# Patient Record
Sex: Female | Born: 1975 | Race: Black or African American | Hispanic: No | Marital: Single | Smoking: Never smoker
Health system: Southern US, Community
[De-identification: ages and names within clinical notes are randomized; demographics above are authoritative.]

## PROBLEM LIST (undated history)

## (undated) ENCOUNTER — Inpatient Hospital Stay (HOSPITAL_COMMUNITY): Payer: Self-pay

## (undated) DIAGNOSIS — G43909 Migraine, unspecified, not intractable, without status migrainosus: Secondary | ICD-10-CM

## (undated) DIAGNOSIS — Z98891 History of uterine scar from previous surgery: Secondary | ICD-10-CM

## (undated) DIAGNOSIS — F32A Depression, unspecified: Secondary | ICD-10-CM

## (undated) DIAGNOSIS — O10919 Unspecified pre-existing hypertension complicating pregnancy, unspecified trimester: Secondary | ICD-10-CM

## (undated) DIAGNOSIS — Z639 Problem related to primary support group, unspecified: Secondary | ICD-10-CM

## (undated) DIAGNOSIS — IMO0002 Reserved for concepts with insufficient information to code with codable children: Secondary | ICD-10-CM

## (undated) DIAGNOSIS — F329 Major depressive disorder, single episode, unspecified: Secondary | ICD-10-CM

## (undated) DIAGNOSIS — T7840XA Allergy, unspecified, initial encounter: Secondary | ICD-10-CM

## (undated) DIAGNOSIS — S62609A Fracture of unspecified phalanx of unspecified finger, initial encounter for closed fracture: Secondary | ICD-10-CM

## (undated) HISTORY — DX: Depression, unspecified: F32.A

## (undated) HISTORY — DX: Migraine, unspecified, not intractable, without status migrainosus: G43.909

## (undated) HISTORY — DX: Major depressive disorder, single episode, unspecified: F32.9

## (undated) HISTORY — DX: Allergy, unspecified, initial encounter: T78.40XA

## (undated) HISTORY — DX: Fracture of unspecified phalanx of unspecified finger, initial encounter for closed fracture: S62.609A

---

## 1998-04-11 ENCOUNTER — Emergency Department (HOSPITAL_COMMUNITY): Admission: EM | Admit: 1998-04-11 | Discharge: 1998-04-11 | Payer: Self-pay | Admitting: Emergency Medicine

## 1998-04-12 ENCOUNTER — Emergency Department (HOSPITAL_COMMUNITY): Admission: EM | Admit: 1998-04-12 | Discharge: 1998-04-12 | Payer: Self-pay | Admitting: Emergency Medicine

## 1998-09-09 ENCOUNTER — Emergency Department (HOSPITAL_COMMUNITY): Admission: EM | Admit: 1998-09-09 | Discharge: 1998-09-09 | Payer: Self-pay | Admitting: Emergency Medicine

## 1999-03-30 ENCOUNTER — Emergency Department (HOSPITAL_COMMUNITY): Admission: EM | Admit: 1999-03-30 | Discharge: 1999-03-30 | Payer: Self-pay

## 1999-04-08 ENCOUNTER — Inpatient Hospital Stay (HOSPITAL_COMMUNITY): Admission: AD | Admit: 1999-04-08 | Discharge: 1999-04-08 | Payer: Self-pay | Admitting: *Deleted

## 2000-10-16 ENCOUNTER — Inpatient Hospital Stay (HOSPITAL_COMMUNITY): Admission: AD | Admit: 2000-10-16 | Discharge: 2000-10-16 | Payer: Self-pay | Admitting: *Deleted

## 2001-03-29 ENCOUNTER — Other Ambulatory Visit: Admission: RE | Admit: 2001-03-29 | Discharge: 2001-03-29 | Payer: Self-pay | Admitting: *Deleted

## 2001-08-30 ENCOUNTER — Other Ambulatory Visit: Admission: RE | Admit: 2001-08-30 | Discharge: 2001-08-30 | Payer: Self-pay | Admitting: Obstetrics and Gynecology

## 2001-09-17 ENCOUNTER — Inpatient Hospital Stay (HOSPITAL_COMMUNITY): Admission: AD | Admit: 2001-09-17 | Discharge: 2001-09-17 | Payer: Self-pay | Admitting: *Deleted

## 2003-10-09 ENCOUNTER — Other Ambulatory Visit: Admission: RE | Admit: 2003-10-09 | Discharge: 2003-10-09 | Payer: Self-pay | Admitting: Obstetrics and Gynecology

## 2003-10-10 ENCOUNTER — Ambulatory Visit (HOSPITAL_COMMUNITY): Admission: RE | Admit: 2003-10-10 | Discharge: 2003-10-10 | Payer: Self-pay | Admitting: Obstetrics and Gynecology

## 2004-01-01 ENCOUNTER — Inpatient Hospital Stay (HOSPITAL_COMMUNITY): Admission: AD | Admit: 2004-01-01 | Discharge: 2004-01-01 | Payer: Self-pay | Admitting: Obstetrics and Gynecology

## 2004-06-24 ENCOUNTER — Inpatient Hospital Stay (HOSPITAL_COMMUNITY): Admission: AD | Admit: 2004-06-24 | Discharge: 2004-06-24 | Payer: Self-pay | Admitting: Family Medicine

## 2004-10-28 ENCOUNTER — Inpatient Hospital Stay (HOSPITAL_COMMUNITY): Admission: AD | Admit: 2004-10-28 | Discharge: 2004-10-29 | Payer: Self-pay | Admitting: Obstetrics

## 2004-10-29 ENCOUNTER — Inpatient Hospital Stay (HOSPITAL_COMMUNITY): Admission: AD | Admit: 2004-10-29 | Discharge: 2004-10-29 | Payer: Self-pay | Admitting: Obstetrics & Gynecology

## 2004-12-31 ENCOUNTER — Inpatient Hospital Stay (HOSPITAL_COMMUNITY): Admission: AD | Admit: 2004-12-31 | Discharge: 2005-01-02 | Payer: Self-pay | Admitting: Obstetrics and Gynecology

## 2005-07-04 ENCOUNTER — Emergency Department (HOSPITAL_COMMUNITY): Admission: EM | Admit: 2005-07-04 | Discharge: 2005-07-04 | Payer: Self-pay | Admitting: Emergency Medicine

## 2006-04-14 ENCOUNTER — Inpatient Hospital Stay (HOSPITAL_COMMUNITY): Admission: AD | Admit: 2006-04-14 | Discharge: 2006-04-14 | Payer: Self-pay | Admitting: Obstetrics & Gynecology

## 2007-06-03 ENCOUNTER — Emergency Department (HOSPITAL_COMMUNITY): Admission: EM | Admit: 2007-06-03 | Discharge: 2007-06-03 | Payer: Self-pay | Admitting: Emergency Medicine

## 2007-09-14 ENCOUNTER — Emergency Department (HOSPITAL_COMMUNITY): Admission: EM | Admit: 2007-09-14 | Discharge: 2007-09-14 | Payer: Self-pay | Admitting: Emergency Medicine

## 2007-11-23 ENCOUNTER — Emergency Department (HOSPITAL_COMMUNITY): Admission: EM | Admit: 2007-11-23 | Discharge: 2007-11-23 | Payer: Self-pay | Admitting: Emergency Medicine

## 2008-03-25 ENCOUNTER — Emergency Department (HOSPITAL_COMMUNITY): Admission: EM | Admit: 2008-03-25 | Discharge: 2008-03-25 | Payer: Self-pay | Admitting: Family Medicine

## 2008-04-12 ENCOUNTER — Encounter: Admission: RE | Admit: 2008-04-12 | Discharge: 2008-04-12 | Payer: Self-pay | Admitting: Specialist

## 2010-06-05 ENCOUNTER — Inpatient Hospital Stay (HOSPITAL_COMMUNITY): Admission: AD | Admit: 2010-06-05 | Discharge: 2009-11-09 | Payer: Self-pay | Admitting: Obstetrics & Gynecology

## 2010-09-16 LAB — URINE MICROSCOPIC-ADD ON: WBC, UA: NONE SEEN WBC/hpf (ref <3)

## 2010-09-16 LAB — URINALYSIS, ROUTINE W REFLEX MICROSCOPIC
Bilirubin Urine: NEGATIVE
Glucose, UA: NEGATIVE mg/dL
Ketones, ur: NEGATIVE mg/dL
Leukocytes, UA: NEGATIVE
Nitrite: NEGATIVE
Protein, ur: NEGATIVE mg/dL
Specific Gravity, Urine: 1.025 (ref 1.005–1.030)
Urobilinogen, UA: 0.2 mg/dL (ref 0.0–1.0)
pH: 6 (ref 5.0–8.0)

## 2010-09-16 LAB — URINE CULTURE: Colony Count: 50000

## 2010-10-22 ENCOUNTER — Ambulatory Visit (INDEPENDENT_AMBULATORY_CARE_PROVIDER_SITE_OTHER): Payer: BC Managed Care – PPO | Admitting: Family Medicine

## 2010-10-22 ENCOUNTER — Encounter: Payer: Self-pay | Admitting: Family Medicine

## 2010-10-22 VITALS — BP 140/88 | HR 76 | Ht 64.0 in | Wt 228.8 lb

## 2010-10-22 DIAGNOSIS — S62609A Fracture of unspecified phalanx of unspecified finger, initial encounter for closed fracture: Secondary | ICD-10-CM | POA: Insufficient documentation

## 2010-10-22 DIAGNOSIS — F418 Other specified anxiety disorders: Secondary | ICD-10-CM

## 2010-10-22 DIAGNOSIS — F341 Dysthymic disorder: Secondary | ICD-10-CM

## 2010-10-22 NOTE — Assessment & Plan Note (Signed)
Occurred 1 month ago Healing well rto prn

## 2010-10-22 NOTE — Patient Instructions (Signed)

## 2010-10-22 NOTE — Assessment & Plan Note (Signed)
Pt refusing meds but is willing to go to counselor rto prn

## 2010-10-22 NOTE — Progress Notes (Signed)
  Subjective:   Lisa Pitts is an 35 y.o. female who presents for evaluation and treatment of depressive symptoms.  Onset approximately 1 month ago, gradually worsening since that time.  Current symptoms include depressed mood, insomnia, fatigue, difficulty concentrating, anxiety, panic attacks, loss of energy/fatigue, disturbed sleep and increased stress at work---a lot of changes at one time.  She works for [employer]. Current treatment for depression:None Sleep problems: Mild   Early awakening:Absent   Energy: Poor Motivation: Fair Concentration: Poor Rumination/worrying: Moderate Memory: Excellent Tearfulness: Absent  Anxiety: Moderate  Panic: Moderate  Overall Mood: No change  Hopelessness: Absent Suicidal ideation: Absent  Other/Psychosocial Stressors: work, relationship Family history positive for depression in the patient's paternal grandmother.  Previous treatment modalities employed include None.  Past episodes of depression:none Organic causes of depression present: None.  Review of Systems Pertinent items are noted in HPI.   Objective:   Mental Status Examination: Posture and motor behavior: Appropriate Dress, grooming, personal hygiene: Appropriate Facial expression: Appropriate Speech: Appropriate Mood: Positive for flat affect Coherency and relevance of thought: Appropriate Thought content: Appropriate Perceptions: Appropriate Orientation:Appropriate Attention and concentration: Appropriate Memory: : Appropriate Information:  Vocabulary: Appropriate Abstract reasoning: Appropriate Judgment: Appropriate     Assessment:     Experiencing the following symptoms of depression most of the day nearly every day for more than two consecutive weeks: depressed mood  Depressive Disorder:  With anxiety and panic attacks  Suicide Risk Assessment:  Suicidal intent: no Suicidal plan: no Access to means for suicide: no Lethality of means for suicide: no Prior  suicide attempts: no Recent exposure to suicide:no  2)  Fractured R middle finger---healing Plan:   anxiety / depression  Reviewed concept of depression as biochemical imbalance of neurotransmitters and rationale for treatment. Instructed patient to contact office or on-call physician promptly should condition worsen or any new symptoms appear and provided on-call telephone numbers.

## 2010-10-31 ENCOUNTER — Telehealth: Payer: Self-pay | Admitting: Family Medicine

## 2010-10-31 NOTE — Telephone Encounter (Signed)
Pt still insist that she discuss these issue at OV. Advise Pt OV will be needed to discuss further and for Rx to be written. Pt decline OV at this time. Pt note that she does not understand why she would need to come back in again since she mention this at last OV.

## 2010-10-31 NOTE — Telephone Encounter (Signed)
She was seen for her finger and depression

## 2010-10-31 NOTE — Telephone Encounter (Signed)
Patient was seen 2 weeks ago - she tried allegra - zyrtec - benedryl for allergies - she said they dont help wants to know if there is something else she can try [facility] [address]

## 2010-10-31 NOTE — Telephone Encounter (Signed)
Please advise 

## 2010-11-03 MED ORDER — FLUTICASONE PROPIONATE 50 MCG/ACT NA SUSP: 2.0000 | Freq: Every day | NASAL | Status: DC

## 2010-11-03 NOTE — Telephone Encounter (Signed)
If truly allergy she should be taking antihistamine regularly---we can call in flonase 2 sprays each nostril qd #1----anything more than that she would need to be seen.

## 2010-11-03 NOTE — Telephone Encounter (Signed)
VM left advising patient RX faxed to the pharmacy     KP

## 2010-11-14 NOTE — H&P (Signed)
Lisa Pitts, Lisa Pitts              ACCOUNT NO.:  [id number]   MEDICAL RECORD NO.:  [id number]          PATIENT TYPE:  INP   LOCATION:  9166                          FACILITY:  [facility]   PHYSICIAN:  Lenoard Aden, M.D.DATE OF BIRTH:  15-Jul-1975   DATE OF ADMISSION:  12/31/2004  DATE OF DISCHARGE:                                HISTORY & PHYSICAL   CHIEF COMPLAINT:  Gestational hypertension at term.   HISTORY OF PRESENT ILLNESS:  The patient is a 35 year old African-American  female, G3, P0, EDC of January 19, 2005 at 37 plus weeks gestation with  elevated blood pressure and headache, for induction.   PAST MEDICAL HISTORY:  1.  TAB in 2002.  2.  Missed AB with D&E at 16 weeks in 2004.   SOCIAL HISTORY:  She is a nonsmoker, nondrinker.  Denies domestic or  physical violence.   MEDICATIONS:  Prenatal vitamins.   ALLERGIES:  She has no known drug allergies.   PREVIOUS HOSPITALIZATIONS:  No medical or surgical hospitalizations.   FAMILY HISTORY:  Hypertension, diabetes, and esophageal cancer.   PRENATAL LABORATORY DATA:  Blood type A positive.  Rubella immune.  Hepatitis and HIV negative.  VDRL nonreactive.  GBS positive.  Pregnancy  complicated by preterm cervical change and positive fetal fibronectin,  status post betamethasone at 29 weeks.   PHYSICAL EXAMINATION:  GENERAL:  She is a well-developed, well-nourished,  African-American female in no acute distress.  HEENT:  Normal.  LUNGS:  Clear.  HEART:  Regular rhythm.  ABDOMEN:  Soft, gravid, and nontender.  Estimated fetal weight by ultrasound  7 to 7.5 pounds.  PELVIC:  Cervix is 4+ cm, 70%, vertex, -2.  EXTREMITIES:  No cords.  NEUROLOGIC:  Nonfocal.   IMPRESSION:  1.  Thirty-seven week OB.  2.  Gestational hypertension with headache.  3.  Favorable cervix.   PLAN:  Proceed with induction.  Risks and benefits discussed.  Watch blood  pressure closely.  Anticipate attempts at vaginal delivery.       RJT/MEDQ  D:   12/31/2004  T:  12/31/2004  Job:  [id number]

## 2011-03-10 ENCOUNTER — Ambulatory Visit: Payer: BC Managed Care – PPO | Admitting: Family Medicine

## 2011-03-25 LAB — URINALYSIS, ROUTINE W REFLEX MICROSCOPIC
Bilirubin Urine: NEGATIVE
Glucose, UA: NEGATIVE
Hgb urine dipstick: NEGATIVE
Ketones, ur: NEGATIVE
Nitrite: NEGATIVE
Protein, ur: NEGATIVE
Specific Gravity, Urine: 1.008
Urobilinogen, UA: 0.2
pH: 7

## 2011-03-25 LAB — PREGNANCY, URINE: Preg Test, Ur: NEGATIVE

## 2011-03-26 ENCOUNTER — Ambulatory Visit: Payer: BC Managed Care – PPO | Admitting: Family Medicine

## 2011-03-26 DIAGNOSIS — Z0289 Encounter for other administrative examinations: Secondary | ICD-10-CM

## 2011-04-06 LAB — URINALYSIS, ROUTINE W REFLEX MICROSCOPIC
Bilirubin Urine: NEGATIVE
Glucose, UA: NEGATIVE
Ketones, ur: NEGATIVE
Nitrite: NEGATIVE
Protein, ur: NEGATIVE
Specific Gravity, Urine: 1.003 — ABNORMAL LOW
Urobilinogen, UA: 0.2
pH: 7

## 2011-04-06 LAB — PREGNANCY, URINE: Preg Test, Ur: NEGATIVE

## 2011-04-06 LAB — URINE MICROSCOPIC-ADD ON

## 2011-05-02 ENCOUNTER — Other Ambulatory Visit: Payer: Self-pay | Admitting: Family Medicine

## 2011-05-15 ENCOUNTER — Emergency Department (HOSPITAL_COMMUNITY)
Admission: EM | Admit: 2011-05-15 | Discharge: 2011-05-16 | Disposition: A | Discharge status: Home / Self Care | Payer: No Typology Code available for payment source | Attending: Emergency Medicine | Admitting: Emergency Medicine

## 2011-05-15 ENCOUNTER — Encounter (HOSPITAL_COMMUNITY): Payer: Self-pay | Admitting: Emergency Medicine

## 2011-05-15 ENCOUNTER — Emergency Department (HOSPITAL_COMMUNITY): Payer: No Typology Code available for payment source

## 2011-05-15 DIAGNOSIS — M25519 Pain in unspecified shoulder: Secondary | ICD-10-CM | POA: Insufficient documentation

## 2011-05-15 DIAGNOSIS — T148XXA Other injury of unspecified body region, initial encounter: Secondary | ICD-10-CM

## 2011-05-15 DIAGNOSIS — IMO0002 Reserved for concepts with insufficient information to code with codable children: Secondary | ICD-10-CM | POA: Insufficient documentation

## 2011-05-15 DIAGNOSIS — S43409A Unspecified sprain of unspecified shoulder joint, initial encounter: Secondary | ICD-10-CM

## 2011-05-15 MED ORDER — IBUPROFEN 800 MG PO TABS
800.0000 mg | ORAL_TABLET | Freq: Once | ORAL | Status: AC
  Administered 2011-05-15: 800 mg via ORAL
  Filled 2011-05-15: qty 1

## 2011-05-15 MED ORDER — OXYCODONE-ACETAMINOPHEN 5-325 MG PO TABS
1.0000 | ORAL_TABLET | Freq: Once | ORAL | Status: AC
  Administered 2011-05-15: 1 via ORAL
  Filled 2011-05-15: qty 1

## 2011-05-15 MED ORDER — DIAZEPAM 5 MG PO TABS
5.0000 mg | ORAL_TABLET | Freq: Once | ORAL | Status: AC
  Administered 2011-05-15: 5 mg via ORAL
  Filled 2011-05-15: qty 1
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
## 2011-05-16 MED ORDER — IBUPROFEN 400 MG PO TABS: 400.0000 mg | ORAL_TABLET | Freq: Three times a day (TID) | ORAL | Status: AC | PRN

## 2011-05-16 MED ORDER — HYDROCODONE-ACETAMINOPHEN 5-325 MG PO TABS: 1.0000 | ORAL_TABLET | ORAL | Status: AC | PRN

## 2011-05-16 MED ORDER — CYCLOBENZAPRINE HCL 10 MG PO TABS: 10.0000 mg | ORAL_TABLET | Freq: Three times a day (TID) | ORAL | Status: AC | PRN
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
## 2011-05-16 NOTE — ED Notes (Signed)
Ortho at bedside to place R arm sling. Pt tolerated without difficulty. Had no further questions regarding discharge instructions.

## 2011-05-20 NOTE — ED Provider Notes (Signed)
Medical screening examination/treatment/procedure(s) were performed by non-physician practitioner and as supervising physician I was immediately available for consultation/collaboration.  Donnetta Hutching, MD 05/20/11 [phone]

## 2011-06-04 ENCOUNTER — Emergency Department (HOSPITAL_COMMUNITY)
Admission: EM | Admit: 2011-06-04 | Discharge: 2011-06-04 | Disposition: A | Discharge status: Home / Self Care | Payer: BC Managed Care – PPO | Attending: Emergency Medicine | Admitting: Emergency Medicine

## 2011-06-04 ENCOUNTER — Encounter (HOSPITAL_COMMUNITY): Payer: Self-pay

## 2011-06-04 DIAGNOSIS — J069 Acute upper respiratory infection, unspecified: Secondary | ICD-10-CM | POA: Insufficient documentation

## 2011-06-04 DIAGNOSIS — R51 Headache: Secondary | ICD-10-CM | POA: Insufficient documentation

## 2011-06-04 MED ORDER — HYDROCOD POLST-CHLORPHEN POLST 10-8 MG/5ML PO LQCR: 5.0000 mL | Freq: Two times a day (BID) | ORAL | Status: DC

## 2011-06-04 MED ORDER — PSEUDOEPHEDRINE-IBUPROFEN 30-200 MG PO CAPS: ORAL_CAPSULE | ORAL | Status: DC

## 2011-06-04 MED ORDER — IBUPROFEN 800 MG PO TABS: 800.0000 mg | ORAL_TABLET | Freq: Once | ORAL | Status: DC

## 2011-06-04 MED ORDER — FLUTICASONE PROPIONATE 50 MCG/ACT NA SUSP: 2.0000 | Freq: Every day | NASAL | Status: DC

## 2011-06-04 NOTE — ED Provider Notes (Signed)
History     CSN: [id number] Arrival date & time: 06/04/2011  3:42 PM   None     Chief Complaint  Patient presents with  . Headache  . Nasal Congestion  . Facial Pain    (Consider location/radiation/quality/duration/timing/severity/associated sxs/prior treatment) Patient is a 35 y.o. female presenting with headaches.  Headache      Patient presents to ER the ER complaining of a 24 hour hx of gradual onset of nasal congestion, runny nose, sinus pressure, headache, itchy watery eyes stating that she takes daily zyrtec D without relief of symptoms. Patient denies known fevers, chills, body aches, CP, SOB, rash. Patient states that sinus pressure and cough became worse last night when she laid down to sleep. Denies alleviating factors.  Past Medical History  Diagnosis Date  . Depression   . Allergy   . Migraines   . Fracture of finger of right hand     History reviewed. No pertinent past surgical history.  Family History  Problem Relation Age of Onset  . Kidney disease Maternal Grandmother     dialysis    History  Substance Use Topics  . Smoking status: Never Smoker   . Smokeless tobacco: Never Used  . Alcohol Use: No    OB History    Grav Para Term Preterm Abortions TAB SAB Ect Mult Living                  Review of Systems  Neurological: Positive for headaches.  All other systems reviewed and are negative.    Allergies  Review of patient's allergies indicates no known allergies.  Home Medications   Current Outpatient Rx  Name Route Sig Dispense Refill  . CETIRIZINE HCL 10 MG PO TABS Oral Take 10 mg by mouth daily.      Marland Kitchen HYDROCOD POLST-CHLORPHEN POLST 10-8 MG/5ML PO LQCR Oral Take 5 mLs by mouth 2 (two) times daily. 140 mL 0  . FLUTICASONE PROPIONATE 50 MCG/ACT NA SUSP Nasal Place 2 sprays into the nose daily. 16 g 1  . PSEUDOEPHEDRINE-IBUPROFEN 30-200 MG PO CAPS  Take as directed on box 84 each 0    BP 177/100  Pulse 116  Temp(Src) 99.9 F (37.7  C) (Oral)  Resp 18  SpO2 100%  LMP 06/03/2011  Physical Exam  Vitals reviewed. Constitutional: She is oriented to person, place, and time. She appears well-developed and well-nourished.  Non-toxic appearance. She does not have a sickly appearance. No distress.  HENT:  Head: Normocephalic and atraumatic.  Right Ear: External ear normal.  Left Ear: External ear normal.  Nose: Mucosal edema and rhinorrhea present. Right sinus exhibits maxillary sinus tenderness. Left sinus exhibits maxillary sinus tenderness.  Mouth/Throat: No oropharyngeal exudate.       Mild erythema of posterior pharynx and tonsils no tonsillar exudate or enlargement. Patent airway. Swallowing secretions well  Eyes: Conjunctivae and EOM are normal. Pupils are equal, round, and reactive to light.  Neck: Normal range of motion. Neck supple.  Cardiovascular: Normal rate, regular rhythm and normal heart sounds.  Exam reveals no gallop and no friction rub.   No murmur heard. Pulmonary/Chest: Effort normal and breath sounds normal. No respiratory distress. She has no wheezes. She has no rales. She exhibits no tenderness.  Abdominal: Soft. She exhibits no distension and no mass. There is no tenderness. There is no rebound and no guarding.  Lymphadenopathy:    She has no cervical adenopathy.  Neurological: She is alert and oriented to person, place,  and time. She has normal reflexes.  Skin: Skin is warm and dry. No rash noted. She is not diaphoretic.  Psychiatric: She has a normal mood and affect.    ED Course  Procedures (including critical care time)  PO ibuprofen  Labs Reviewed - No data to display No results found.   1. Upper respiratory tract infection       MDM  Afebrile, non toxic appearing, no meningeal signs. Nasal mucosa and sinus pressure for 24 hours. Patient has PCP for follow up.         Jenness Corner, PA 06/04/11 1707  Jenness Corner, PA 06/05/11 0028

## 2011-06-04 NOTE — ED Notes (Signed)
Pt. Reports yesterday symptoms started.  Stated "I have facial pain and my nose is congested".  Zyrtec D taken with no relief.

## 2011-06-06 NOTE — ED Provider Notes (Signed)
Medical screening examination/treatment/procedure(s) were performed by non-physician practitioner and as supervising physician I was immediately available for consultation/collaboration.  Raeford Razor, MD 06/06/11 [phone]

## 2012-04-23 ENCOUNTER — Inpatient Hospital Stay (HOSPITAL_COMMUNITY): Payer: Self-pay

## 2012-04-23 ENCOUNTER — Encounter (HOSPITAL_COMMUNITY): Payer: Self-pay | Admitting: *Deleted

## 2012-04-23 ENCOUNTER — Inpatient Hospital Stay (HOSPITAL_COMMUNITY)
Admission: AD | Admit: 2012-04-23 | Discharge: 2012-04-23 | Disposition: A | Discharge status: Home / Self Care | Payer: Self-pay | Source: Ambulatory Visit | Attending: Obstetrics & Gynecology | Admitting: Obstetrics & Gynecology

## 2012-04-23 DIAGNOSIS — Z3201 Encounter for pregnancy test, result positive: Secondary | ICD-10-CM | POA: Insufficient documentation

## 2012-04-23 LAB — POCT PREGNANCY, URINE: Preg Test, Ur: POSITIVE — AB
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
## 2012-04-23 NOTE — Progress Notes (Signed)
Notified of Positive UPT Pt had no other complaints but is unsure of her LMP. U/S ordered

## 2012-04-23 NOTE — MAU Note (Addendum)
Pt thinks she may be pregnant. Took pregnancy test at home was unsure of the results. Taking morning primrose supplement and was worried that may make her test positive.Pt not surwe when her last period was maybe June.

## 2012-04-23 NOTE — ED Notes (Signed)
U/S results discussed with pt by T. Bailey,CNM. Pt advised to f/u and start prenatal care in office on Monday.

## 2012-06-05 ENCOUNTER — Inpatient Hospital Stay (HOSPITAL_COMMUNITY): Payer: BC Managed Care – PPO

## 2012-06-05 ENCOUNTER — Encounter (HOSPITAL_COMMUNITY): Payer: Self-pay | Admitting: *Deleted

## 2012-06-05 ENCOUNTER — Encounter (HOSPITAL_COMMUNITY)
Admission: AD | Disposition: A | Discharge status: Home / Self Care | Payer: Self-pay | Source: Ambulatory Visit | Attending: Obstetrics

## 2012-06-05 ENCOUNTER — Inpatient Hospital Stay (HOSPITAL_COMMUNITY)
Admission: AD | Admit: 2012-06-05 | Discharge: 2012-06-08 | DRG: 650 | Disposition: A | Discharge status: Home / Self Care | Payer: BC Managed Care – PPO | Source: Ambulatory Visit | Attending: Obstetrics | Admitting: Obstetrics

## 2012-06-05 ENCOUNTER — Encounter (HOSPITAL_COMMUNITY): Payer: Self-pay | Admitting: Anesthesiology

## 2012-06-05 ENCOUNTER — Inpatient Hospital Stay (HOSPITAL_COMMUNITY): Payer: BC Managed Care – PPO | Admitting: Anesthesiology

## 2012-06-05 DIAGNOSIS — O4590 Premature separation of placenta, unspecified, unspecified trimester: Principal | ICD-10-CM | POA: Diagnosis present

## 2012-06-05 DIAGNOSIS — O321XX0 Maternal care for breech presentation, not applicable or unspecified: Secondary | ICD-10-CM | POA: Diagnosis present

## 2012-06-05 DIAGNOSIS — O9902 Anemia complicating childbirth: Secondary | ICD-10-CM | POA: Diagnosis present

## 2012-06-05 DIAGNOSIS — IMO0002 Reserved for concepts with insufficient information to code with codable children: Secondary | ICD-10-CM

## 2012-06-05 DIAGNOSIS — O1002 Pre-existing essential hypertension complicating childbirth: Secondary | ICD-10-CM | POA: Diagnosis present

## 2012-06-05 DIAGNOSIS — O10919 Unspecified pre-existing hypertension complicating pregnancy, unspecified trimester: Secondary | ICD-10-CM

## 2012-06-05 DIAGNOSIS — O3430 Maternal care for cervical incompetence, unspecified trimester: Secondary | ICD-10-CM | POA: Diagnosis present

## 2012-06-05 DIAGNOSIS — O4290 Premature rupture of membranes, unspecified as to length of time between rupture and onset of labor, unspecified weeks of gestation: Secondary | ICD-10-CM | POA: Diagnosis present

## 2012-06-05 DIAGNOSIS — O09529 Supervision of elderly multigravida, unspecified trimester: Secondary | ICD-10-CM | POA: Diagnosis present

## 2012-06-05 DIAGNOSIS — D649 Anemia, unspecified: Secondary | ICD-10-CM | POA: Diagnosis present

## 2012-06-05 DIAGNOSIS — Z98891 History of uterine scar from previous surgery: Secondary | ICD-10-CM

## 2012-06-05 DIAGNOSIS — Z639 Problem related to primary support group, unspecified: Secondary | ICD-10-CM

## 2012-06-05 HISTORY — DX: Unspecified pre-existing hypertension complicating pregnancy, unspecified trimester: O10.919

## 2012-06-05 HISTORY — DX: Problem related to primary support group, unspecified: Z63.9

## 2012-06-05 HISTORY — DX: Reserved for concepts with insufficient information to code with codable children: IMO0002

## 2012-06-05 HISTORY — DX: History of uterine scar from previous surgery: Z98.891

## 2012-06-05 LAB — COMPREHENSIVE METABOLIC PANEL
ALT: 17 U/L (ref 0–35)
AST: 15 U/L (ref 0–37)
CO2: 21 mEq/L (ref 19–32)
Calcium: 9.1 mg/dL (ref 8.4–10.5)
Chloride: 103 mEq/L (ref 96–112)
Creatinine, Ser: 0.46 mg/dL — ABNORMAL LOW (ref 0.50–1.10)
GFR calc Af Amer: >90 mL/min (ref >90)
GFR calc non Af Amer: >90 mL/min (ref >90)
Glucose, Bld: 99 mg/dL (ref 70–99)
Total Bilirubin: 0.4 mg/dL (ref 0.3–1.2)

## 2012-06-05 LAB — RPR: RPR Ser Ql: NONREACTIVE

## 2012-06-05 LAB — CBC
MCHC: 33.2 g/dL (ref 30.0–36.0)
Platelets: 377 10*3/uL (ref 150–400)
RDW: 13.7 % (ref 11.5–15.5)
WBC: 11.4 10*3/uL — ABNORMAL HIGH (ref 4.0–10.5)

## 2012-06-05 LAB — DIC (DISSEMINATED INTRAVASCULAR COAGULATION)PANEL
INR: 0.94 (ref 0.00–1.49)
Platelets: 377 10*3/uL (ref 150–400)
Smear Review: NONE SEEN
aPTT: 32 seconds (ref 24–37)

## 2012-06-05 SURGERY — Surgical Case
Anesthesia: Regional | Site: Abdomen | Wound class: Clean Contaminated

## 2012-06-05 MED ORDER — FAMOTIDINE IN NACL 20-0.9 MG/50ML-% IV SOLN
INTRAVENOUS | Status: AC
  Filled 2012-06-05: qty 50

## 2012-06-05 MED ORDER — PRENATAL MULTIVITAMIN CH
1.0000 | ORAL_TABLET | Freq: Every day | ORAL | Status: DC
  Administered 2012-06-05 – 2012-06-08 (×4): 1 via ORAL
  Filled 2012-06-05 (×3): qty 1

## 2012-06-05 MED ORDER — MENTHOL 3 MG MT LOZG: 1.0000 | LOZENGE | OROMUCOSAL | Status: DC | PRN

## 2012-06-05 MED ORDER — ONDANSETRON HCL 4 MG PO TABS: 4.0000 mg | ORAL_TABLET | ORAL | Status: DC | PRN

## 2012-06-05 MED ORDER — OXYTOCIN 40 UNITS IN LACTATED RINGERS INFUSION - SIMPLE MED: 62.5000 mL/h | INTRAVENOUS | Status: AC

## 2012-06-05 MED ORDER — LANOLIN HYDROUS EX OINT: 1.0000 "application " | TOPICAL_OINTMENT | CUTANEOUS | Status: DC | PRN

## 2012-06-05 MED ORDER — DIPHENHYDRAMINE HCL 25 MG PO CAPS
25.0000 mg | ORAL_CAPSULE | Freq: Four times a day (QID) | ORAL | Status: DC | PRN
  Administered 2012-06-07 – 2012-06-08 (×2): 25 mg via ORAL
  Filled 2012-06-05 (×2): qty 1

## 2012-06-05 MED ORDER — SCOPOLAMINE 1 MG/3DAYS TD PT72
MEDICATED_PATCH | TRANSDERMAL | Status: AC
  Filled 2012-06-05: qty 1

## 2012-06-05 MED ORDER — PROMETHAZINE HCL 25 MG/ML IJ SOLN: 6.2500 mg | INTRAMUSCULAR | Status: DC | PRN

## 2012-06-05 MED ORDER — MORPHINE SULFATE (PF) 0.5 MG/ML IJ SOLN
INTRAMUSCULAR | Status: DC | PRN
  Administered 2012-06-05: .1 mg via INTRATHECAL

## 2012-06-05 MED ORDER — SIMETHICONE 80 MG PO CHEW
80.0000 mg | CHEWABLE_TABLET | Freq: Three times a day (TID) | ORAL | Status: DC
  Administered 2012-06-05 – 2012-06-08 (×10): 80 mg via ORAL

## 2012-06-05 MED ORDER — ONDANSETRON HCL 4 MG/2ML IJ SOLN: 4.0000 mg | Freq: Three times a day (TID) | INTRAMUSCULAR | Status: DC | PRN

## 2012-06-05 MED ORDER — IBUPROFEN 600 MG PO TABS
600.0000 mg | ORAL_TABLET | Freq: Four times a day (QID) | ORAL | Status: DC
  Administered 2012-06-05 – 2012-06-08 (×12): 600 mg via ORAL
  Filled 2012-06-05 (×12): qty 1

## 2012-06-05 MED ORDER — LACTATED RINGERS IV SOLN
INTRAVENOUS | Status: DC | PRN
  Administered 2012-06-05 (×3): via INTRAVENOUS

## 2012-06-05 MED ORDER — OXYTOCIN 10 UNIT/ML IJ SOLN
40.0000 [IU] | INTRAVENOUS | Status: DC | PRN
  Administered 2012-06-05: 40 [IU] via INTRAVENOUS

## 2012-06-05 MED ORDER — SIMETHICONE 80 MG PO CHEW: 80.0000 mg | CHEWABLE_TABLET | ORAL | Status: DC | PRN

## 2012-06-05 MED ORDER — SODIUM CHLORIDE 0.9 % IJ SOLN: 3.0000 mL | INTRAMUSCULAR | Status: DC | PRN

## 2012-06-05 MED ORDER — METHYLERGONOVINE MALEATE 0.2 MG PO TABS: 0.2000 mg | ORAL_TABLET | ORAL | Status: DC | PRN

## 2012-06-05 MED ORDER — NALBUPHINE SYRINGE 5 MG/0.5 ML
5.0000 mg | INJECTION | INTRAMUSCULAR | Status: DC | PRN
  Administered 2012-06-05 (×2): 10 mg via INTRAVENOUS
  Filled 2012-06-05 (×2): qty 1

## 2012-06-05 MED ORDER — ZOLPIDEM TARTRATE 5 MG PO TABS: 5.0000 mg | ORAL_TABLET | Freq: Every evening | ORAL | Status: DC | PRN

## 2012-06-05 MED ORDER — METHYLERGONOVINE MALEATE 0.2 MG/ML IJ SOLN: 0.2000 mg | INTRAMUSCULAR | Status: DC | PRN

## 2012-06-05 MED ORDER — BUPIVACAINE IN DEXTROSE 0.75-8.25 % IT SOLN
INTRATHECAL | Status: DC | PRN
  Administered 2012-06-05: 1.6 mL via INTRATHECAL

## 2012-06-05 MED ORDER — DIPHENHYDRAMINE HCL 50 MG/ML IJ SOLN
12.5000 mg | INTRAMUSCULAR | Status: DC | PRN
  Administered 2012-06-05: 12.5 mg via INTRAVENOUS

## 2012-06-05 MED ORDER — FENTANYL CITRATE 0.05 MG/ML IJ SOLN
INTRAMUSCULAR | Status: DC | PRN
  Administered 2012-06-05: 100 ug via INTRAVENOUS
  Administered 2012-06-05: 25 ug via INTRATHECAL

## 2012-06-05 MED ORDER — DIBUCAINE 1 % RE OINT: 1.0000 "application " | TOPICAL_OINTMENT | RECTAL | Status: DC | PRN

## 2012-06-05 MED ORDER — OXYCODONE-ACETAMINOPHEN 5-325 MG PO TABS
1.0000 | ORAL_TABLET | ORAL | Status: DC | PRN
  Administered 2012-06-05 – 2012-06-08 (×9): 2 via ORAL
  Filled 2012-06-05 (×9): qty 2

## 2012-06-05 MED ORDER — KETOROLAC TROMETHAMINE 30 MG/ML IJ SOLN: 30.0000 mg | Freq: Four times a day (QID) | INTRAMUSCULAR | Status: AC | PRN

## 2012-06-05 MED ORDER — TETANUS-DIPHTH-ACELL PERTUSSIS 5-2.5-18.5 LF-MCG/0.5 IM SUSP: 0.5000 mL | Freq: Once | INTRAMUSCULAR | Status: DC

## 2012-06-05 MED ORDER — METOCLOPRAMIDE HCL 5 MG/ML IJ SOLN: 10.0000 mg | Freq: Three times a day (TID) | INTRAMUSCULAR | Status: DC | PRN

## 2012-06-05 MED ORDER — MEPERIDINE HCL 25 MG/ML IJ SOLN: 6.2500 mg | INTRAMUSCULAR | Status: DC | PRN

## 2012-06-05 MED ORDER — SCOPOLAMINE 1 MG/3DAYS TD PT72
1.0000 | MEDICATED_PATCH | Freq: Once | TRANSDERMAL | Status: AC
  Administered 2012-06-05: 1.5 mg via TRANSDERMAL

## 2012-06-05 MED ORDER — LACTATED RINGERS IV SOLN
INTRAVENOUS | Status: DC
  Administered 2012-06-05: 08:00:00 via INTRAVENOUS

## 2012-06-05 MED ORDER — KETOROLAC TROMETHAMINE 30 MG/ML IJ SOLN
30.0000 mg | Freq: Four times a day (QID) | INTRAMUSCULAR | Status: AC | PRN
  Administered 2012-06-05: 30 mg via INTRAVENOUS

## 2012-06-05 MED ORDER — NALOXONE HCL 0.4 MG/ML IJ SOLN: 0.4000 mg | INTRAMUSCULAR | Status: DC | PRN

## 2012-06-05 MED ORDER — FENTANYL CITRATE 0.05 MG/ML IJ SOLN
25.0000 ug | INTRAMUSCULAR | Status: DC | PRN
  Administered 2012-06-05: 50 ug via INTRAVENOUS

## 2012-06-05 MED ORDER — ONDANSETRON HCL 4 MG/2ML IJ SOLN: 4.0000 mg | INTRAMUSCULAR | Status: DC | PRN

## 2012-06-05 MED ORDER — ONDANSETRON HCL 4 MG/2ML IJ SOLN
INTRAMUSCULAR | Status: DC | PRN
  Administered 2012-06-05: 4 mg via INTRAVENOUS

## 2012-06-05 MED ORDER — LACTATED RINGERS IV SOLN: INTRAVENOUS | Status: DC

## 2012-06-05 MED ORDER — SENNOSIDES-DOCUSATE SODIUM 8.6-50 MG PO TABS
2.0000 | ORAL_TABLET | Freq: Every day | ORAL | Status: DC
  Administered 2012-06-05 – 2012-06-07 (×3): 2 via ORAL

## 2012-06-05 MED ORDER — ESMOLOL HCL 10 MG/ML IV SOLN
INTRAVENOUS | Status: DC | PRN
  Administered 2012-06-05 (×2): 10 mg via INTRAVENOUS

## 2012-06-05 MED ORDER — WITCH HAZEL-GLYCERIN EX PADS: 1.0000 "application " | MEDICATED_PAD | CUTANEOUS | Status: DC | PRN

## 2012-06-05 MED ORDER — DIPHENHYDRAMINE HCL 50 MG/ML IJ SOLN
INTRAMUSCULAR | Status: AC
  Filled 2012-06-05: qty 1

## 2012-06-05 MED ORDER — CEFAZOLIN SODIUM-DEXTROSE 2-3 GM-% IV SOLR
INTRAVENOUS | Status: DC | PRN
  Administered 2012-06-05: 2 g via INTRAVENOUS

## 2012-06-05 MED ORDER — NALBUPHINE SYRINGE 5 MG/0.5 ML
5.0000 mg | INJECTION | INTRAMUSCULAR | Status: DC | PRN
  Administered 2012-06-06: 10 mg via SUBCUTANEOUS
  Administered 2012-06-06: 5 mg via SUBCUTANEOUS
  Filled 2012-06-05 (×4): qty 1

## 2012-06-05 MED ORDER — LABETALOL HCL 100 MG PO TABS
100.0000 mg | ORAL_TABLET | Freq: Two times a day (BID) | ORAL | Status: DC
  Administered 2012-06-05 – 2012-06-07 (×5): 100 mg via ORAL
  Filled 2012-06-05 (×5): qty 1

## 2012-06-05 MED ORDER — KETOROLAC TROMETHAMINE 30 MG/ML IJ SOLN
INTRAMUSCULAR | Status: AC
  Filled 2012-06-05: qty 1

## 2012-06-05 MED ORDER — CITRIC ACID-SODIUM CITRATE 334-500 MG/5ML PO SOLN
ORAL | Status: AC
  Administered 2012-06-05: 09:00:00 via ORAL
  Filled 2012-06-05: qty 15

## 2012-06-05 MED ORDER — NALOXONE HCL 1 MG/ML IJ SOLN
1.0000 ug/kg/h | INTRAVENOUS | Status: DC | PRN
  Filled 2012-06-05: qty 2

## 2012-06-05 MED ORDER — DIPHENHYDRAMINE HCL 25 MG PO CAPS
25.0000 mg | ORAL_CAPSULE | ORAL | Status: DC | PRN
  Administered 2012-06-05 – 2012-06-07 (×3): 25 mg via ORAL
  Filled 2012-06-05 (×4): qty 1

## 2012-06-05 MED ORDER — DIPHENHYDRAMINE HCL 50 MG/ML IJ SOLN: 25.0000 mg | INTRAMUSCULAR | Status: DC | PRN

## 2012-06-05 MED ORDER — FENTANYL CITRATE 0.05 MG/ML IJ SOLN
INTRAMUSCULAR | Status: AC
  Filled 2012-06-05: qty 2

## 2012-06-05 MED ORDER — ACETAMINOPHEN 10 MG/ML IV SOLN
1000.0000 mg | Freq: Four times a day (QID) | INTRAVENOUS | Status: AC | PRN
  Administered 2012-06-05: 1000 mg via INTRAVENOUS
  Filled 2012-06-05 (×2): qty 100

## 2012-06-05 MED ORDER — MIDAZOLAM HCL 2 MG/2ML IJ SOLN: 0.5000 mg | Freq: Once | INTRAMUSCULAR | Status: DC | PRN

## 2012-06-05 SURGICAL SUPPLY — 39 items
BARRIER ADHS 3X4 INTERCEED (GAUZE/BANDAGES/DRESSINGS) ×2 IMPLANT
BRR ADH 4X3 ABS CNTRL BYND (GAUZE/BANDAGES/DRESSINGS) ×1
CLOTH BEACON ORANGE TIMEOUT ST (SAFETY) ×2 IMPLANT
CONTAINER PREFILL 10% NBF 15ML (MISCELLANEOUS) IMPLANT
DRSG COVADERM 4X10 (GAUZE/BANDAGES/DRESSINGS) ×1 IMPLANT
DRSG OPSITE POSTOP 4X10 (GAUZE/BANDAGES/DRESSINGS) IMPLANT
DURAPREP 26ML APPLICATOR (WOUND CARE) ×2 IMPLANT
ELECT REM PT RETURN 9FT ADLT (ELECTROSURGICAL) ×2
ELECTRODE REM PT RTRN 9FT ADLT (ELECTROSURGICAL) ×1 IMPLANT
EXTRACTOR VACUUM KIWI (MISCELLANEOUS) IMPLANT
EXTRACTOR VACUUM M CUP 4 TUBE (SUCTIONS) IMPLANT
GLOVE BIO SURGEON STRL SZ 6.5 (GLOVE) ×2 IMPLANT
GLOVE BIOGEL PI IND STRL 7.0 (GLOVE) ×2 IMPLANT
GLOVE BIOGEL PI INDICATOR 7.0 (GLOVE) ×2
GLOVE SURG SS PI 7.5 STRL IVOR (GLOVE) ×1 IMPLANT
GOWN PREVENTION PLUS LG XLONG (DISPOSABLE) ×6 IMPLANT
KIT ABG SYR 3ML LUER SLIP (SYRINGE) ×2 IMPLANT
NEEDLE HYPO 25X5/8 SAFETYGLIDE (NEEDLE) ×2 IMPLANT
NS IRRIG 1000ML POUR BTL (IV SOLUTION) ×2 IMPLANT
PACK C SECTION WH (CUSTOM PROCEDURE TRAY) ×2 IMPLANT
PAD OB MATERNITY 4.3X12.25 (PERSONAL CARE ITEMS) ×2 IMPLANT
SLEEVE SCD COMPRESS KNEE MED (MISCELLANEOUS) ×2 IMPLANT
STAPLER VISISTAT 35W (STAPLE) ×1 IMPLANT
STRIP CLOSURE SKIN 1/2X4 (GAUZE/BANDAGES/DRESSINGS) IMPLANT
SUT MNCRL 0 VIOLET CTX 36 (SUTURE) IMPLANT
SUT MON AB 2-0 CT1 27 (SUTURE) ×2 IMPLANT
SUT MON AB 4-0 PS1 27 (SUTURE) IMPLANT
SUT MON AB-0 CT1 36 (SUTURE) ×2 IMPLANT
SUT MONOCRYL 0 CTX 36 (SUTURE) ×3
SUT PLAIN 0 NONE (SUTURE) IMPLANT
SUT PLAIN 2 0 XLH (SUTURE) ×1 IMPLANT
SUT VIC AB 0 CT1 36 (SUTURE) ×1 IMPLANT
SUT VIC AB 0 CTX 36 (SUTURE)
SUT VIC AB 0 CTX36XBRD ANBCTRL (SUTURE) ×3 IMPLANT
SUT VIC AB 2-0 CT1 27 (SUTURE)
SUT VIC AB 2-0 CT1 TAPERPNT 27 (SUTURE) ×1 IMPLANT
TOWEL OR 17X24 6PK STRL BLUE (TOWEL DISPOSABLE) ×4 IMPLANT
TRAY FOLEY CATH 14FR (SET/KITS/TRAYS/PACK) ×1 IMPLANT
WATER STERILE IRR 1000ML POUR (IV SOLUTION) ×2 IMPLANT

## 2012-06-05 NOTE — Anesthesia Preprocedure Evaluation (Signed)
Anesthesia Evaluation  Patient identified by MRN, date of birth, ID band Patient awake    Reviewed: Allergy & Precautions, H&P , NPO status , Patient's Chart, lab work & pertinent test results  Airway Mallampati: III      Dental No notable dental hx.    Pulmonary neg pulmonary ROS,  breath sounds clear to auscultation  Pulmonary exam normal       Cardiovascular Exercise Tolerance: Good negative cardio ROS  Rhythm:regular Rate:Normal     Neuro/Psych  Headaches, PSYCHIATRIC DISORDERS negative neurological ROS  negative psych ROS   GI/Hepatic negative GI ROS, Neg liver ROS,   Endo/Other  negative endocrine ROSMorbid obesity  Renal/GU negative Renal ROS  negative genitourinary   Musculoskeletal   Abdominal Normal abdominal exam  (+)   Peds  Hematology negative hematology ROS (+)   Anesthesia Other Findings Depression     Allergy        Migraines     Fracture of finger of right hand    Reproductive/Obstetrics (+) Pregnancy                           Anesthesia Physical Anesthesia Plan  ASA: III and emergent  Anesthesia Plan: Spinal   Post-op Pain Management:    Induction:   Airway Management Planned:   Additional Equipment:   Intra-op Plan:   Post-operative Plan:   Informed Consent: I have reviewed the patients History and Physical, chart, labs and discussed the procedure including the risks, benefits and alternatives for the proposed anesthesia with the patient or authorized representative who has indicated his/her understanding and acceptance.     Plan Discussed with: Anesthesiologist, CRNA and Surgeon  Anesthesia Plan Comments:         Anesthesia Quick Evaluation

## 2012-06-05 NOTE — Op Note (Signed)
Cesarean Section Procedure Note  Indications: abruptio placenta, malpresentation: Extreme Prematurity and SROM  Pre-operative Diagnosis: 24 week 2 day pregnancy.  Post-operative Diagnosis: same  Surgeon: Lenoard Aden   Assistants: Ernestina Penna  Anesthesia: Local anesthesia 0.25.% bupivacaine and Spinal anesthesia  ASA Class: 2  Procedure Details  The patient was seen in the Holding Room. The risks, benefits, complications, treatment options, and expected outcomes were discussed with the patient.  The patient concurred with the proposed plan, giving informed consent. The risks of anesthesia, infection, bleeding and possible injury to other organs discussed. Injury to bowel, bladder, or ureter with possible need for repair discussed. Possible need for transfusion with secondary risks of hepatitis or HIV acquisition discussed. Post operative complications to include but not limited to DVT, PE and Pneumonia noted. The site of surgery properly noted/marked. The patient was taken to Operating Room # 1, identified as Lisa Pitts and the procedure verified as C-Section Delivery. A Time Out was held and the above information confirmed.  After induction of anesthesia, the patient was draped and prepped in the usual sterile manner. A Pfannenstiel incision was made and carried down through the subcutaneous tissue to the fascia. Fascial incision was made and extended transversely using Mayo scissors. The fascia was separated from the underlying rectus tissue superiorly and inferiorly. The peritoneum was identified and entered. Peritoneal incision was extended longitudinally. The utero-vesical peritoneal reflection was incised transversely and the bladder flap was bluntly freed from the lower uterine segment. A Vertical Classical incisionwas made. Delivered from [facility]obia presentation was a  female with Apgar scores of pending Nicu at one minute and pending NICU at five minutes. Bulb suctioning gently  performed. Neonatal team in attendance.After the umbilical cord was clamped and cut cord blood was obtained for evaluation. The placenta was removed  and appeared consistent with a completer abruption. The uterus was curetted with a dry lap pack. Good hemostasis was noted.The uterine outline, tubes and ovaries appeared normal. The uterine incision was closed with running locked sutures of 0 Monocryl x 3 layers with baseball stitch on serosa. Hemostasis was observed. Lavage was carried out until clear.The parietal peritoneum was closed with a running 2-0 Monocryl suture. The fascia was then reapproximated with running sutures of 0 Monocryl. 2-0 plain on [place] tissue.The skin was reapproximated with staples.  Instrument, sponge, and needle counts were correct prior the abdominal closure and at the conclusion of the case.   Findings: Above Complete abruption  Estimated Blood Loss:  800         Drains: foley                 Specimens: placenta                 Complications:  None; patient tolerated the procedure well.         Disposition: PACU - hemodynamically stable.         Condition: stable  Attending Attestation: I performed the procedure.

## 2012-06-05 NOTE — OR Nursing (Signed)
No cord blood or cord pH obtained per Dr. Billy Coast

## 2012-06-05 NOTE — MAU Note (Signed)
Pt came in EMS  With c/o water broke 4 am. Leaking noted  from vagina. Notified Dr. Ernestina Penna  And on unit. Dr. Overton Mam man at bedside  Did SSE. Pesseray removed SVE 4cm dialted and ruptured and breech presentation. C/S reccommended to pt. Pt refusing c-section at this time. Pt does not want to call any family at this time

## 2012-06-05 NOTE — ED Notes (Signed)
Dr. Billy Coast at bedside discussing options with pt.

## 2012-06-05 NOTE — MAU Note (Signed)
Pt reports she woke up with wetness  And having cramping

## 2012-06-05 NOTE — Anesthesia Postprocedure Evaluation (Signed)
Anesthesia Post Note  Patient: Lisa Pitts  Procedure(s) Performed: Procedure(s) (LRB): CESAREAN SECTION (N/A)  Anesthesia type: Spinal  Patient location: PACU  Post pain: Pain level controlled  Post assessment: Post-op Vital signs reviewed  Last Vitals:  Filed Vitals:   06/05/12 1045  BP: 155/93  Pulse: 91  Temp:   Resp: 20    Post vital signs: Reviewed  Level of consciousness: awake  Complications: No apparent anesthesia complications

## 2012-06-05 NOTE — Progress Notes (Signed)
Please see history and physical and operative note dictated by Dr. Billy Coast.  Patient seen by me immediately upon arrival. Brief history reviewed with patient: documented cervical incompetence with prior 16 week loss, late to care with this pregnancy and noted to have cervical dilation and effacement at 18 weeks, patient had refused cerclage, the patient had refused bedrest and inpatient admission. Of note patient also with recent fetal fibronectin which was positive. Patient notes abdominal pains over the past 24 hours for which he thought at first was gas and later recognized as contractions. Patient also notes rupture of membranes with bloody fluid.  Patient appears hemodynamically stable. Obese, gravid abdomen. Speculum exam revealed grossly ruptured with bloody amniotic fluid. Pessary removed. Cervix unable to be visualized due to redundant vaginal walls and copious bloody fluid. Fetal decelerations noted on the monitor. Baseline in the 120s with tips into the low 100s to 90s to 80s. These appear to occur with contractions based on patient's pain however tocometry is difficult to follow. Given patient's pain decision was made to proceed with a digital exam. Breech fetal parts felt coming through the cervix. Cervix dilated to 4 cm. Stat bedside ultrasound was done which revealed anhydramnios, confirmed breech presentation. No clear placental abruption or retroplacental clot was noted. Stat coagulopathy labs as well as type and screen and CBC have been ordered and IV access was established.  Discussion was had with the patient regarding the current situation. I discussed with the patient my concern for placental abruption, active labor, breech presentation, prematurity. I discussed with the patient my recommendation for urgent primary cesarean section if she intended on resuscitation of the neonate. Patient asked multiple times about refusing intervention for fetal well-being. Patient also asked to proceed  with a vaginal delivery. We then discussed the risks of worsening fetal intolerance to labor, possible placental abruption, and risks of fetal head entrapment with attempted vaginal delivery of a breech fetus at this very early gestational age. We also discussed the uncertainty of fetal outcome despite all intervention including cesarean section and resuscitation by the neonatologist. We discussed  that this baby was very premature and we would be unable to give her a clear picture of outcome. I did discuss with the patient and a very long NICU stay ahead of the baby and he high morbidity and mortality at this gestational age. Patient initially was refusing cesarean section but was unclear about whether she would want the baby resuscitated. I discussed again with the patient that if she attempted a vaginal delivery it is unclear to me at this time if the fetus would survive labor either due to worsening tolerance to labor, placental abruption, and head entrapment. During this time fetal decelerations continued to be present. I discussed with the patient the urgent nature of this decision and that if she wanted everything done for this baby I recommended proceeding immediately with primary cesarean section. Patient was continuing to contemplate her options in detail Dr. Billy Coast arrived. Upon his arrival he took over primary management for this patient. Please see his operative notes and history and physical for further details.   Lisa Pitts A. 06/05/2012 9:43 AM

## 2012-06-05 NOTE — Anesthesia Procedure Notes (Signed)
Spinal  Patient location during procedure: OR Start time: 06/05/2012 8:41 AM Staffing Anesthesiologist: Brayton Caves R Performed by: anesthesiologist  Preanesthetic Checklist Completed: patient identified, site marked, surgical consent, pre-op evaluation, timeout performed, IV checked, risks and benefits discussed and monitors and equipment checked Spinal Block Patient position: sitting Prep: DuraPrep Patient monitoring: heart rate, cardiac monitor, continuous pulse ox and blood pressure Approach: midline Location: L3-4 Injection technique: single-shot Needle Needle type: Sprotte  Needle gauge: 24 G Needle length: 9 cm Assessment Sensory level: T4 Additional Notes Patient identified.  Risk benefits discussed including failed block, incomplete pain control, headache, nerve damage, paralysis, blood pressure changes, nausea, vomiting, reactions to medication both toxic or allergic, and postpartum back pain.  Patient expressed understanding and wished to proceed.  All questions were answered.  Sterile technique used throughout procedure.  CSF was clear.  No parasthesia or other complications.  Please see nursing notes for vital signs.

## 2012-06-05 NOTE — ED Notes (Signed)
Pt has consented to c-section. Pt signed consent and off to OR

## 2012-06-05 NOTE — OR Nursing (Signed)
Dr. Billy Coast primary surgeon with Dr. Ernestina Penna assisting for STAT cesarean section for placental abruption at [redacted] weeks gestation

## 2012-06-05 NOTE — Transfer of Care (Signed)
Immediate Anesthesia Transfer of Care Note  Patient: Lisa Pitts  Procedure(s) Performed: Procedure(s) (LRB) with comments: CESAREAN SECTION (N/A)  Patient Location: PACU  Anesthesia Type:Spinal  Level of Consciousness: awake, alert  and oriented  Airway & Oxygen Therapy: Patient Spontanous Breathing  Post-op Assessment: Report given to PACU RN and Post -op Vital signs reviewed and stable  Post vital signs: stable  Complications: No apparent anesthesia complications

## 2012-06-05 NOTE — H&P (Signed)
NAMEGERALDY, AKRIDGE NO.:  [id number]  MEDICAL RECORD NO.:  [id number]  LOCATION:  [facility]                          FACILITY:  [facility]  PHYSICIAN:  Lenoard Aden, M.D.DATE OF BIRTH:  [birth year]  DATE OF ADMISSION:  06/05/2012 DATE OF DISCHARGE:                             HISTORY & PHYSICAL   CHIEF COMPLAINT:  Spontaneous rupture of membranes this morning.  HISTORY OF PRESENT ILLNESS:  She is a 36 year old African American female, G4, P1, history of term vaginal delivery x1, history of second trimester loss and subsequent pregnancy, who presented as a late registrant for care at approximately 20 weeks.  At the time of presentation, she was seen in the emergency room initially at 18 weeks with probable cervical insufficiency.  Recommendations at that time due to her history and due to shortened cervical length of approximately 1 cm was to proceed with cerclage.  The patient declined cerclage and stated at that time that she was going to avoid the pregnancy, has scheduled abortion in [place] for which she did not show up and then returned for prenatal care.  She continued to decline cerclage.  She was started on vaginal progesterone at bedtime and seen weekly in the office.  The pessary was placed at this juncture in order to assist in the process, the experimental nature of the pessary was discussed and the situation was discussed with Maternal Fetal Medicine.  She presents now this morning with bleeding and spontaneous rupture of membranes.  ALLERGIES:  She has no known drug allergies.  MEDICATIONS:  Prenatal vitamins and progesterone.  She is also on labetalol.  PAST MEDICAL HISTORY:  She has a personal history also of chronic hypertension for which she was noncompliant with starting medications this pregnancy and was on no medications prior to this gestation. Previous pregnancy history is for a 7-pound delivery, history of TAB at 12 weeks, history of  spontaneous pregnancy losses at 12 weeks and 6 weeks.  No other abdominal surgeries noted.  PHYSICAL EXAMINATION:  GENERAL:  She is a well-developed, well- nourished, African American female, in moderate amount of distress and contractions. HEENT:  Normal. NECK:  Supple.  Full range of motion. LUNGS:  Clear. HEART:  Regular rate and rhythm. ABDOMEN:  Soft, gravid, slightly tender to palpation. PELVIC:  Exam performed.  Toco reveals cervix to be 4 cm dilated, fetal parts presenting, ultrasound confirms breech presentation, and no amniotic fluid.  Fetal heart tones in the 130-150 beat per minute range with deep variable decelerations noted with contractions and confirmation of spontaneous rupture of membranes, questionable clinical suspicion for abruption.  IMPRESSION: 1. A 24-week intrauterine pregnancy with spontaneous rupture of     membranes.  The patient has been noncompliant with care. 2. Probable chronic hypertension.  PLAN:  Proceed with emergent low segment, probable classical trans cesarean section.  Risks of anesthesia, infection, bleeding, injury to abdominal organs, need for repair was discussed, delayed versus immediate complications to include bowel and bladder injury noted.  The patient acknowledged.  The patient was apprised of possible risks of viable to the fetus due to poor dating and due to extreme prematurity. The NICU was notified.  Antibiotics were  ordered.     Lenoard Aden, M.D.     RJT/MEDQ  D:  06/05/2012  T:  06/05/2012  Job:  [id number]

## 2012-06-05 NOTE — MAU Provider Note (Signed)
History   Patient presented to MAU by EMS with possible SROM at [redacted]w[redacted]d, CHTN and non compliant  history OF TAB, SAB, FT SVD and SAB at 16 weeks.  Late to [facility] at 20 wks. Declined cerclage  Has been using prometrium vaginal suppossitory . On weekly surveillance. Incontinence Pessary had been placed - [place]. Non compliant with bedrest and declined hospitalization. Dr Ernestina Penna to bedside for assistment as vaginal bleeding noted . Possible abruption.      CSN: [id number]  Arrival date and time: 06/05/12 0740   None     Chief Complaint  Patient presents with  . Rupture of Membranes   HPI  OB History    Grav Para Term Preterm Abortions TAB SAB Ect Mult Living   4 1   2 2           Past Medical History  Diagnosis Date  . Depression   . Allergy   . Migraines   . Fracture of finger of right hand     No past surgical history on file.  Family History  Problem Relation Age of Onset  . Kidney disease Maternal Grandmother     dialysis    History  Substance Use Topics  . Smoking status: Never Smoker   . Smokeless tobacco: Never Used  . Alcohol Use: No    Allergies: No Known Allergies  Prescriptions prior to admission  Medication Sig Dispense Refill  . cetirizine (ZYRTEC) 10 MG tablet Take 10 mg by mouth daily.        . chlorpheniramine-HYDROcodone (TUSSIONEX PENNKINETIC ER) 10-8 MG/5ML LQCR Take 5 mLs by mouth 2 (two) times daily.  140 mL  0  . fluticasone (FLONASE) 50 MCG/ACT nasal spray Place 2 sprays into the nose daily.  16 g  1  . Pseudoephedrine-Ibuprofen (ADVIL COLD & SINUS LIQUI-GELS) 30-200 MG CAPS Take as directed on box  84 each  0    Review of Systems  Constitutional: Negative.   HENT: Negative.   Eyes: Negative.   Respiratory: Negative.   Cardiovascular: Negative.        CHTN - no meds - non compliant  Gastrointestinal: Negative.   Genitourinary: Negative.        Incontinence Pessary  placed  Musculoskeletal: Negative.   Skin: Negative.    Neurological: Negative.   Endo/Heme/Allergies: Negative.   Psychiatric/Behavioral: Negative.    Physical Exam   Last menstrual period 06/03/2011.  Physical Exam  Constitutional: She is oriented to person, place, and time. She appears well-developed.  Eyes: Conjunctivae normal and EOM are normal. Pupils are equal, round, and reactive to light.  Neck: Normal range of motion. Neck supple.  Cardiovascular: Normal rate, regular rhythm and normal heart sounds.   Respiratory: Effort normal and breath sounds normal.  GI: Soft. Bowel sounds are normal.       C/o cramping  Genitourinary: Vagina normal.       Vaginal bleeding and possible abruption Advised C/s. Pateint declined  As fetal status is declining No FHT's can be heard.   Musculoskeletal: Normal range of motion.  Neurological: She is alert and oriented to person, place, and time. She has normal reflexes.  Skin: Skin is warm and dry.  Psychiatric: She has a normal mood and affect.    MAU Course  Procedures  Examination by Dr Ernestina Penna. Vaginal bleeding - possible abruption - no FHT's. Patient has refused a C/s to attempt to save the baby. Dr Billy Coast and Dr. Ernestina Penna in collaboration for patient  management.  Type & Screen, CBC, CMP,  RPR, No coags labs at this time   Assessment and Plan  Patient has refused C/s . No FHT's. Patient advised re Coagulopathy issues that can occur. At this time of examination condition stable. Patient has refused intervention. Labs pending. Dr. Billy Coast to bedside - patient had bedside Sono  and breech presentation , FHT - cardiac activity seen and decelerations noted. Stat C/s called - prep for OR.  Earl Gala, CNM 06/05/2012, 7:52 AM

## 2012-06-06 ENCOUNTER — Encounter (HOSPITAL_COMMUNITY): Payer: Self-pay

## 2012-06-06 DIAGNOSIS — Z98891 History of uterine scar from previous surgery: Secondary | ICD-10-CM

## 2012-06-06 DIAGNOSIS — IMO0002 Reserved for concepts with insufficient information to code with codable children: Secondary | ICD-10-CM

## 2012-06-06 DIAGNOSIS — Z639 Problem related to primary support group, unspecified: Secondary | ICD-10-CM

## 2012-06-06 DIAGNOSIS — O10919 Unspecified pre-existing hypertension complicating pregnancy, unspecified trimester: Secondary | ICD-10-CM

## 2012-06-06 HISTORY — DX: Problem related to primary support group, unspecified: Z63.9

## 2012-06-06 HISTORY — DX: Reserved for concepts with insufficient information to code with codable children: IMO0002

## 2012-06-06 HISTORY — DX: Unspecified pre-existing hypertension complicating pregnancy, unspecified trimester: O10.919

## 2012-06-06 HISTORY — DX: History of uterine scar from previous surgery: Z98.891

## 2012-06-06 LAB — CBC
MCHC: 33.2 g/dL (ref 30.0–36.0)
MCV: 92.8 fL (ref 78.0–100.0)
Platelets: 329 10*3/uL (ref 150–400)
RDW: 14 % (ref 11.5–15.5)
WBC: 10.1 10*3/uL (ref 4.0–10.5)
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
## 2012-06-06 NOTE — Addendum Note (Signed)
Addendum  created 06/06/12 1016 by Suella Grove, CRNA   Modules edited:Charges VN, Notes Section

## 2012-06-06 NOTE — Progress Notes (Signed)
06/06/12 1300  Clinical Encounter Type  Visited With Patient  Visit Type Initial;Spiritual support;Social support  Referral From Nurse Lisa Pitts)  Spiritual Encounters  Spiritual Needs Emotional (Many theological questions related to premature delivery)  Stress Factors  Patient Stress Factors Loss of control;Major life changes;Family relationships;Lack of knowledge (Many questions about baby's prognosis/likely outcomes)    Visited with Lisa Pitts for 60 minutes upon referral from bedside nurse Lisa Pitts.  Thank you for the consult!  Lisa Pitts has many questions about baby's prognosis and likely outcomes/needs, so I encouraged her to talk with baby's bedside RN, multidisciplinary rounding team in NICU, and Lisa Pitts with [facility].  Let her know also of resource room in NICU and encouraged her to explore books and to collect relevant handouts that she can then review in her room.  Provided pastoral listening and reflection through Lisa Pitts's many theological questions (such as, "I prayed every day for my baby to be healthy and born at term; how could God let this [premature delivery] happen?  What does that mean?).  Served as a witness to her story and as a conversation partner with her questions.  Let her know of chaplain services and availability.    Will continue to follow for spiritual and emotional support.  [address] [place], [place] [phone]

## 2012-06-06 NOTE — Anesthesia Postprocedure Evaluation (Signed)
  Anesthesia Post-op Note  Patient: Lisa Pitts  Procedure(s) Performed: Procedure(s) (LRB) with comments: CESAREAN SECTION (N/A)  Patient Location: A-ICU  Anesthesia Type:Spinal  Level of Consciousness: awake  Airway and Oxygen Therapy: Patient Spontanous Breathing  Post-op Pain: none  Post-op Assessment: Patient's Cardiovascular Status Stable, Respiratory Function Stable, Patent Airway, No signs of Nausea or vomiting, Adequate PO intake, Pain level controlled, No headache, No backache, No residual numbness and No residual motor weakness  Post-op Vital Signs: Reviewed and stable  Complications: No apparent anesthesia complications
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
## 2012-06-07 LAB — COMPREHENSIVE METABOLIC PANEL
ALT: 12 U/L (ref 0–35)
AST: 13 U/L (ref 0–37)
Albumin: 2.8 g/dL — ABNORMAL LOW (ref 3.5–5.2)
Alkaline Phosphatase: 85 U/L (ref 39–117)
BUN: 5 mg/dL — ABNORMAL LOW (ref 6–23)
CO2: 22 mEq/L (ref 19–32)
Calcium: 9.2 mg/dL (ref 8.4–10.5)
Chloride: 103 mEq/L (ref 96–112)
Creatinine, Ser: 0.64 mg/dL (ref 0.50–1.10)
GFR calc Af Amer: >90 mL/min (ref >90)
GFR calc non Af Amer: >90 mL/min (ref >90)
Glucose, Bld: 95 mg/dL (ref 70–99)
Potassium: 3.7 mEq/L (ref 3.5–5.1)
Sodium: 138 mEq/L (ref 135–145)
Total Bilirubin: 0.5 mg/dL (ref 0.3–1.2)
Total Protein: 6.8 g/dL (ref 6.0–8.3)

## 2012-06-07 LAB — CBC
HCT: 32.3 % — ABNORMAL LOW (ref 36.0–46.0)
Hemoglobin: 10.7 g/dL — ABNORMAL LOW (ref 12.0–15.0)
MCH: 30.6 pg (ref 26.0–34.0)
MCHC: 33.1 g/dL (ref 30.0–36.0)
MCV: 92.3 fL (ref 78.0–100.0)
Platelets: 414 10*3/uL — ABNORMAL HIGH (ref 150–400)
RBC: 3.5 MIL/uL — ABNORMAL LOW (ref 3.87–5.11)
RDW: 14 % (ref 11.5–15.5)
WBC: 13.6 10*3/uL — ABNORMAL HIGH (ref 4.0–10.5)

## 2012-06-07 LAB — URIC ACID: Uric Acid, Serum: 4.9 mg/dL (ref 2.4–7.0)

## 2012-06-07 MED ORDER — LABETALOL HCL 100 MG PO TABS
100.0000 mg | ORAL_TABLET | Freq: Three times a day (TID) | ORAL | Status: DC
  Administered 2012-06-07 – 2012-06-08 (×3): 100 mg via ORAL
  Filled 2012-06-07 (×6): qty 1
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
## 2012-06-08 MED ORDER — LABETALOL HCL 100 MG PO TABS: 200.0000 mg | ORAL_TABLET | Freq: Three times a day (TID) | ORAL | Status: DC

## 2012-06-08 MED ORDER — POLYSACCHARIDE IRON COMPLEX 150 MG PO CAPS: 150.0000 mg | ORAL_CAPSULE | Freq: Two times a day (BID) | ORAL | Status: DC

## 2012-06-08 MED ORDER — OXYCODONE-ACETAMINOPHEN 5-325 MG PO TABS: 1.0000 | ORAL_TABLET | Freq: Four times a day (QID) | ORAL | Status: DC | PRN

## 2012-06-08 MED ORDER — IBUPROFEN 600 MG PO TABS: 600.0000 mg | ORAL_TABLET | Freq: Four times a day (QID) | ORAL | Status: DC | PRN

## 2012-06-08 MED ORDER — LABETALOL HCL 100 MG PO TABS: 100.0000 mg | ORAL_TABLET | Freq: Three times a day (TID) | ORAL | Status: DC

## 2012-06-08 NOTE — Progress Notes (Signed)
Subjective: Postpartum Day 3: Cesarean Delivery secondary to: PPROM  with abruption at [redacted]w[redacted]d  Patient reports tolerating PO, + flatus, + BM and no problems voiding.  +BM no complaints, up ad lib without syncope Pain well controlled with po meds, using motrin and percocet  BF: pumping Mood stable, bonding well.  Objective: Vital signs in last 24 hours: Temp:  [98.2 F (36.8 C)-98.6 F (37 C)] 98.2 F (36.8 C) (12/11 0704) Pulse Rate:  [92-96] 92  (12/11 0704) Resp:  [18] 18  (12/11 0704) BP: (142-156)/(83-91) 142/85 mmHg (12/11 0704) SpO2:  [99 %-100 %] 100 % (12/11 0704)  Physical Exam:  General: alert, cooperative and no distress Heart: RRR Lungs: CTAB Abdomen: BS x 4, soft, N/T Uterine Fundus: firm, -2/u Incision: healing well, no significant drainage, no dehiscence, no significant erythema, Staples removed. Lochia: appropriate DVT Evaluation: No evidence of DVT seen on physical exam. Negative Homan's sign. No cords or calf tenderness. No significant calf/ankle edema.   Basename 06/07/12 1140 06/06/12 0500  HGB 10.7* 9.4*  HCT 32.3* 28.3*   Anemia of pregnancy  Assessment/Plan: Status post Cesarean section. Doing well postoperatively.  Discharge home with standard precautions and return to clinic in 4-6 weeks.   Terran Hollenkamp, CNM. 06/08/2012, 11:03 AM

## 2012-06-08 NOTE — Progress Notes (Signed)
Pt refused tdap states she already had shot  Information given  And informed pt  i could  Not not find  Documentation only   For  Flu vaccine

## 2012-06-08 NOTE — Progress Notes (Signed)
Chaplain Note: Paged by nurse at 5:31pm on, Tuesday, 12/102013.  After speaking to nurse and responding to a call from [facility] office, arrived at the room at 6:10pm.  Patient was lying in the dark and watching animated TV.  Chaplain introduced himself and after a few pleasantries, the patient asked the Chaplain if children are a from God.  The discussion proceeded to her "almost abortion" decision earlier in the pregnancy and to her decision at her entry to the hospital to either let the baby likely die with a vaginal delivery or to allow for a C-section and move the baby to the NICU.  She made the latter choice in the very short space of time she had to make the decision.  She was in a mental process of "looking back" and wondering if she did the right thing."  Chaplain affirmed her decision and acknowledged the situation and how it called for an immediate decision, which she made toward the life of the baby.  She wanted God's affirmation, also.  The Chaplain said he felt God comes down on the side of life, but that he couldn't really speak for God.  And the discussion moved to grace regardless of what decision she made.    As conversation proceeded, conversation hinted at a measure of trepidation whether she could handle raising another baby (her oldest daughter is 7), and what if she is disabled, although Chaplain sensed this was not the sole focus.  Cost and time were also factors.  After a time, the other daughter, Phil Dopp, arrived with her paternal grandmother (different father than the second baby).  They spent time speaking of baby names, school activities and just laying together.  Chaplain pointed to how very sweet her daughter was, an that she could do it again.  He also encouraged her with mention of her evident internal fortitude to handle whatever came up in her life.  She had done it past and come through, and to look back and be encouraged by her past success and God's evidence of  presence in her life.  Patient's mother lives in [place] and the patient said if her mother was in town, she'd be all over this baby, and there'd be no problems.  Mother did call twice while Chaplain was present.  Chaplain also made reference to looking into some single parent groups, e.g. [employer] for personal support and activities.  Patient is employed.  Patient is scheduled to be discharged Wednesday, 12/11.  Rema Jasmine, Chaplain Pager: [phone]

## 2012-06-08 NOTE — Progress Notes (Signed)
Ur chart review completed.  

## 2012-06-08 NOTE — Progress Notes (Signed)
06/08/12 1100  Clinical Encounter Type  Visited With Patient  Visit Type Follow-up;Spiritual support;Social support  Spiritual Encounters  Spiritual Needs Emotional    Lisa Pitts is struggling with the "uncertainty" (her word) about the prognosis and likely outcomes for her baby, which is a big source of stress.  Per pt, choosing a name for her baby is also a struggle right now.  Provided pastoral listening and reflection as she processed her feelings and both theological and logistical questions.  Will continue to follow for support.  [address] [place], [place] [phone]

## 2012-06-08 NOTE — Discharge Summary (Signed)
Physician Discharge Summary  Patient ID: Lisa Pitts MRN: [id number] DOB/AGE: 35-Mar-1977 35 y.o.  Admit date: 06/05/2012 Discharge date: 06/08/2012 Admission Diagnoses: Admission vis EMS to MAU - SROM at [redacted]w[redacted]d with partial abruption.   Discharge Diagnoses:  Active Problems:  S/P emergency cesarean section PPROM 24 wks - 12/8  Postpartum care following cesarean delivery (12/8)  Maternal anemia complicating pregnancy, childbirth, or the puerperium  Chronic hypertension with exacerbation during pregnancy  Dysfunctional family processes   Discharged Condition: stable  Hospital Course: Patient elevated B/P that needed treatment. Emergency C/s for Abruption  and breech presentation at [redacted]w[redacted]d. Delivered Viable Female infant at [redacted]w[redacted]d. Mother had uncomplicated post - op course. Continues to have elevated B/P in the post partum phase. Had commenced on Labetalol 100 mgs TID and on discharge form hospital , increased dosage to 200 mg to Labetalol 200 mgs po TID daily. Baby remains in NICU due to severe prematurity and condition associated with gestation age at birth.   Consults: None  Significant Diagnostic Studies: labs: Prenatal labs - normal and radiology: Ultrasound: anataomy normal. Had further USS for Cervical length. Shortened cervix noted at [redacted] weeks gestation and pessary placed.  Treatments: IV hydration, antibiotics: Ancef and analgesia: acetaminophen w/ codeine  Discharge Exam: Blood pressure 142/85, pulse 92, temperature 98.2 F (36.8 C), temperature source Oral, resp. rate 18, height 5\' 6"  (1.676 m), weight 231 lb 3.2 oz (104.872 kg), last menstrual period 06/03/2011, SpO2 100.00%, unknown if currently breastfeeding.  ROS:  General appearance: alert, cooperative and no distress Affect: AAO x3 Breasts: N/T Lungs: CTAB CV: RRR Abdomen: soft, N/T, B/S x 4 and Flatus and no BM as yet. Fundus: -4/u, firm Incision:D&I and staples removed - steri strips placed - dry maternal  pad placed over incision site to stop friction. GI: Normal GU: voiding with no problems Extremities: No swelling/edema bilaterally    Disposition: 01-Home or Self Care Baby remain in NICU. Patient has not visited the baby in NICU. She has eluded to the fact that and has stated that she is "Going to let the baby die". This is concerning and the chaplain and social worker are aware.  Discharge Orders    Future Orders Please Complete By Expires   Diet general      Discharge instructions      Comments:   [facility] booklet    Patient has been advised to f/u for B/P check and wound check in 10- 14 days - to make appointment at [facility] with Dr. Billy Coast.  Can stop Prometrium medication daily. Continues on Labetalol 200 mg po TID po daily. Analgesia as advised. Continue on PNV po daily. The social worker had reviewed her case and will f/u in the community. The patient is not bonding with the baby. Mother is afraid to see the baby in NICU and has not visited with the baby. Advised to pump breast milk for the baby. Social work are trying to provide mother with pump.   Discharge Medications: Roxicodone 5/325mg s 1 -2 tablets po q6 hrly PRN Disp # 20 tablets                                        Ibuprofen 600mg s po Q.6 hrly PRN  Prenatal Vitamins po daily.                                        Labetalol 200 mgs po TID daily  Patient is s/p Emergency C/s at [redacted]w[redacted]d for PPROM and Abruption. Elevated B/p's. Will need f/u B/p and medication surveillance. Baby remains in NICU. Social work are involved in this case.                                               Signed: Earl Gala, CNM. 06/08/2012, 11:11 AM

## 2012-06-08 NOTE — Progress Notes (Signed)
Pt  Discharged   To home   Teaching complete  Baby in nicu

## 2012-06-09 LAB — TYPE AND SCREEN
ABO/RH(D): A POS
Antibody Screen: NEGATIVE
Unit division: 0

## 2012-06-12 NOTE — Discharge Summary (Signed)
Agree w/ above note. Reason for urgent c/s is breech in labor w/ fetal parts coming through cervix, labor, SROM, abruption, probable chorio, non-reassurring fetal testing.   Of note, at the time funneled, dilated cervix was noted, pt declined cervical cerclage.   Mercedies Ganesh A. 06/12/2012 11:21 AM

## 2012-11-26 ENCOUNTER — Encounter (HOSPITAL_COMMUNITY): Payer: Self-pay | Admitting: *Deleted

## 2012-11-26 ENCOUNTER — Emergency Department (HOSPITAL_COMMUNITY)
Admission: EM | Admit: 2012-11-26 | Discharge: 2012-11-26 | Disposition: A | Discharge status: Home / Self Care | Payer: Self-pay | Attending: Emergency Medicine | Admitting: Emergency Medicine

## 2012-11-26 DIAGNOSIS — Z79899 Other long term (current) drug therapy: Secondary | ICD-10-CM | POA: Insufficient documentation

## 2012-11-26 DIAGNOSIS — Z9889 Other specified postprocedural states: Secondary | ICD-10-CM | POA: Insufficient documentation

## 2012-11-26 DIAGNOSIS — Z862 Personal history of diseases of the blood and blood-forming organs and certain disorders involving the immune mechanism: Secondary | ICD-10-CM | POA: Insufficient documentation

## 2012-11-26 DIAGNOSIS — Z792 Long term (current) use of antibiotics: Secondary | ICD-10-CM | POA: Insufficient documentation

## 2012-11-26 DIAGNOSIS — B354 Tinea corporis: Secondary | ICD-10-CM | POA: Insufficient documentation

## 2012-11-26 DIAGNOSIS — Z8659 Personal history of other mental and behavioral disorders: Secondary | ICD-10-CM | POA: Insufficient documentation

## 2012-11-26 DIAGNOSIS — Z8669 Personal history of other diseases of the nervous system and sense organs: Secondary | ICD-10-CM | POA: Insufficient documentation

## 2012-11-26 MED ORDER — NAPROXEN 500 MG PO TABS: 500.0000 mg | ORAL_TABLET | Freq: Two times a day (BID) | ORAL | Status: DC

## 2012-11-26 MED ORDER — CEPHALEXIN 500 MG PO CAPS: 500.0000 mg | ORAL_CAPSULE | Freq: Four times a day (QID) | ORAL | Status: DC

## 2012-11-26 MED ORDER — TERBINAFINE HCL 250 MG PO TABS: 250.0000 mg | ORAL_TABLET | Freq: Every day | ORAL | Status: DC

## 2012-11-26 MED ORDER — KETOCONAZOLE 2 % EX CREA: TOPICAL_CREAM | Freq: Every day | CUTANEOUS | Status: DC

## 2012-11-26 NOTE — ED Notes (Signed)
Lesion  To the rt side of her nose in February.  The area gets better then gets worse for the few days

## 2012-11-26 NOTE — ED Provider Notes (Signed)
History    This chart was scribed for a non-physician practitioner, Dierdre Forth, PA-C, working with Ward Givens, MD by Frederik Pear, ED Scribe. This patient was seen in room TR06C/TR06C and the patient's care was started at 2241.   CSN: [id number]  Arrival date & time 11/26/12  1949   First MD Initiated Contact with Patient 11/26/12 2241      Chief Complaint  Patient presents with  . Facial Pain    (Consider location/radiation/quality/duration/timing/severity/associated sxs/prior treatment) The history is provided by the patient and medical records. No language interpreter was used.    HPI Comments: Lisa Pitts is a 37 y.o. female who presents to the Emergency Department complaining of a gradually worsening, moderate lesion with a dark spot to the right side of her nose that is severely painful and is not aggravated nor alleviated by anything and initially began in Feb without scaling that healed completely before returning in the same place 2 days ago. She denies fever, chills, emesis, diarrhea, rashes, or any other associated symptoms. She states that she uses benzyl peroxide to wash her face with a hydroquinone moisturizer, and she is concerned that the products reirritated the area. She states that she treated the area at home with neosporin without relief. She reports that her daughter has a similar lesion on her hand. She denies that she is not currently breastfeeding. He has no chronic medical conditions that require daily medications.  Past Medical History  Diagnosis Date  . Depression   . Allergy   . Migraines   . Fracture of finger of right hand   . S/P emergency cesarean section PPROM 24 wks - 12/8 06/06/2012  . Postpartum care following cesarean delivery 06/06/2012  . Maternal anemia complicating pregnancy, childbirth, or the puerperium 06/06/2012    Chronic IDA and superimposed ABL anemia  . Chronic hypertension with exacerbation during pregnancy 06/06/2012  .  Dysfunctional family processes 06/06/2012    Past Surgical History  Procedure Laterality Date  . Cesarean section  06/05/2012    Procedure: CESAREAN SECTION;  Surgeon: Tresa Endo A. Ernestina Penna, MD;  Location: [facility];  Service: Obstetrics;  Laterality: N/A;    Family History  Problem Relation Age of Onset  . Kidney disease Maternal Grandmother     dialysis    History  Substance Use Topics  . Smoking status: Never Smoker   . Smokeless tobacco: Never Used  . Alcohol Use: No    OB History   Grav Para Term Preterm Abortions TAB SAB Ect Mult Living   5 2  1 3 2 1          Review of Systems  Constitutional: Negative for diaphoresis, appetite change, fatigue and unexpected weight change.  HENT: Negative for neck stiffness.   Eyes: Negative for visual disturbance.  Respiratory: Negative for cough, chest tightness, shortness of breath and wheezing.   Cardiovascular: Negative for chest pain.  Gastrointestinal: Negative for nausea, vomiting, abdominal pain, diarrhea and constipation.  Endocrine: Negative for polydipsia, polyphagia and polyuria.  Genitourinary: Negative for dysuria, urgency, frequency and hematuria.  Musculoskeletal: Negative for back pain.  Skin:       Lesion   Allergic/Immunologic: Negative for immunocompromised state.  Neurological: Negative for syncope, light-headedness and headaches.  Hematological: Does not bruise/bleed easily.  Psychiatric/Behavioral: Negative for sleep disturbance. The patient is not nervous/anxious.     Allergies  Review of patient's allergies indicates no known allergies.  Home Medications   Current Outpatient Rx  Name  Route  Sig  Dispense  Refill  . labetalol (NORMODYNE) 100 MG tablet   Oral   Take 2 tablets (200 mg total) by mouth 3 (three) times daily.   90 tablet   2   . neomycin-bacitracin-polymyxin (NEOSPORIN) ointment   Topical   Apply 1 application topically 2 (two) times daily as needed (to wound).         . Prenatal  Vit-Fe Fumarate-FA (MULTIVITAMIN-PRENATAL) 27-0.8 MG TABS   Oral   Take 1 tablet by mouth daily at 12 noon.         . cephALEXin (KEFLEX) 500 MG capsule   Oral   Take 1 capsule (500 mg total) by mouth 4 (four) times daily.   40 capsule   0   . ketoconazole (NIZORAL) 2 % cream   Topical   Apply topically daily.   15 g   0   . naproxen (NAPROSYN) 500 MG tablet   Oral   Take 1 tablet (500 mg total) by mouth 2 (two) times daily with a meal.   30 tablet   0   . terbinafine (LAMISIL) 250 MG tablet   Oral   Take 1 tablet (250 mg total) by mouth daily. For two weeks   14 tablet   0     BP 170/94  Pulse 89  Temp(Src) 98.8 F (37.1 C) (Oral)  Resp 16  SpO2 100%  Physical Exam  Nursing note and vitals reviewed. Constitutional: She appears well-developed and well-nourished. No distress.  HENT:  Head: Normocephalic and atraumatic.  Mouth/Throat: Oropharynx is clear and moist. No oropharyngeal exudate.  Eyes: Conjunctivae are normal. No scleral icterus.  Neck: Normal range of motion. Neck supple.  Cardiovascular: Normal rate, regular rhythm and intact distal pulses.   Pulmonary/Chest: Effort normal and breath sounds normal. No respiratory distress. She has no wheezes.  Abdominal: Soft. Bowel sounds are normal. She exhibits no mass. There is no tenderness. There is no rebound and no guarding.  Musculoskeletal: Normal range of motion. She exhibits no edema.  Neurological: She is alert.  Speech is clear and goal oriented Moves extremities without ataxia  Skin: Skin is warm and dry. Lesion noted. She is not diaphoretic.  Circular lesion with central clearing and raised erythematous border with darkened skin in the center and evidence of previous scab that is oozing mild serous fluid.   Psychiatric: She has a normal mood and affect.    ED Course  Procedures (including critical care time)  DIAGNOSTIC STUDIES: Oxygen Saturation is 100% on room air, normal by my  interpretation.    COORDINATION OF CARE:  22:45- Discussed planned course of treatment with the patient, including an oral anti-fungal and a course of antibiotics, who is agreeable at this time.  Labs Reviewed - No data to display No results found.   1. Ringworm of body       MDM  Sreenidhi R Horkey presents with lesion to the right side of her nose. Patient with history and physical consistent with tinea corporis. Patient states she has been using Neosporin and moisturizing cream which I believe gives that oozing look. Pts daughter in the room who also currently has tinea corporis which they are treating with Lamisil.  Given oral and topical ketoconazole and have patient followup with dermatology.  No visual changes, lesions on other parts of the body. I have also discussed reasons to return immediately to the ER.  Patient expresses understanding and agrees with plan.  I personally performed the services  described in this documentation, which was scribed in my presence. The recorded information has been reviewed and is accurate.   Dahlia Client Daris Harkins, PA-C 11/26/12 2303

## 2012-11-27 NOTE — ED Provider Notes (Signed)
Medical screening examination/treatment/procedure(s) were performed by non-physician practitioner and as supervising physician I was immediately available for consultation/collaboration. Diella Gillingham, MD, FACEP   Pacey Altizer L Basil Blakesley, MD 11/27/12 0017 

## 2013-10-24 IMAGING — US US OB FOLLOW-UP
1 series · 13 of 22 positions shown · non-contrast
Comparison: none

[Series 1: us ob follow-up · 13 of 22 slices shown]
[im 1/22]
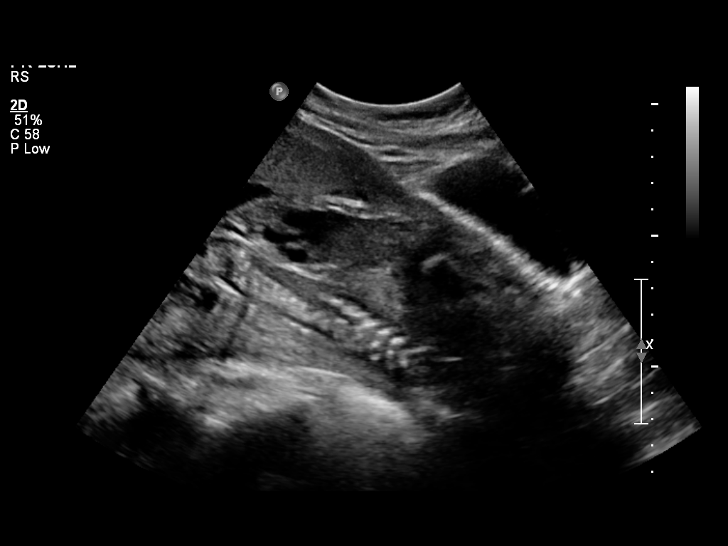
[im 3/22]
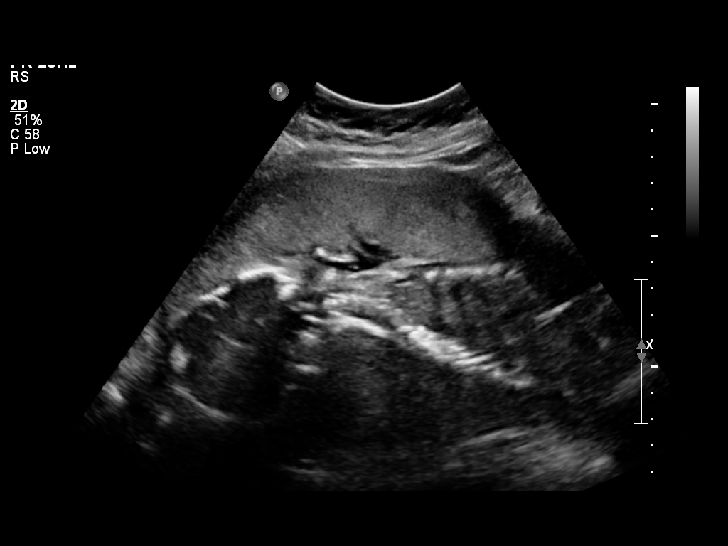
[im 5/22]
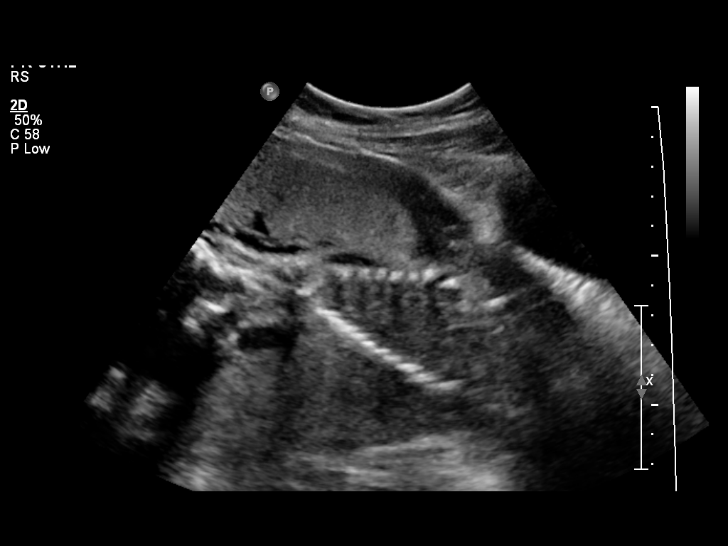
[im 6/22]
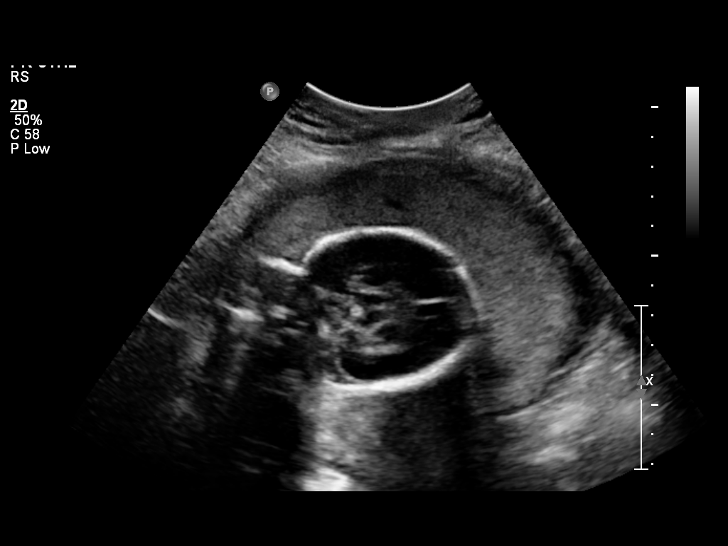
[im 8/22]
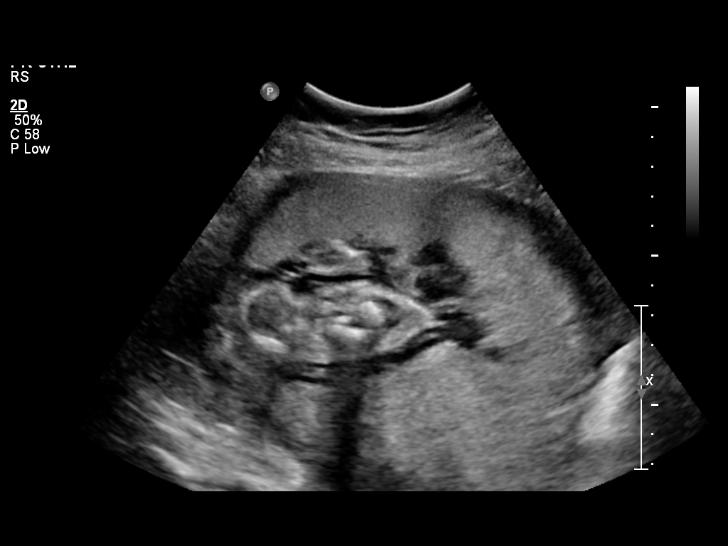
[im 10/22]
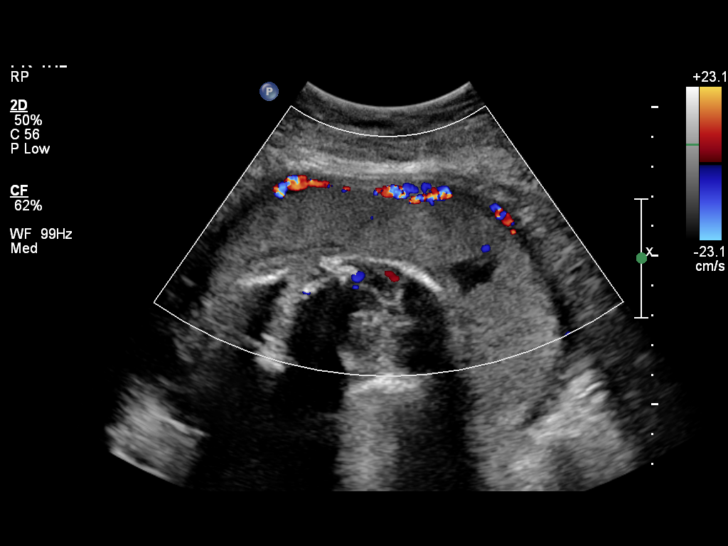
[im 12/22]
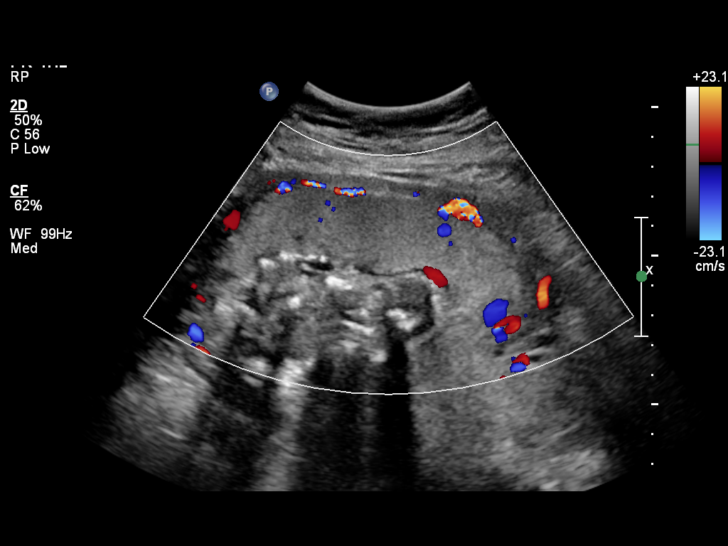
[im 13/22]
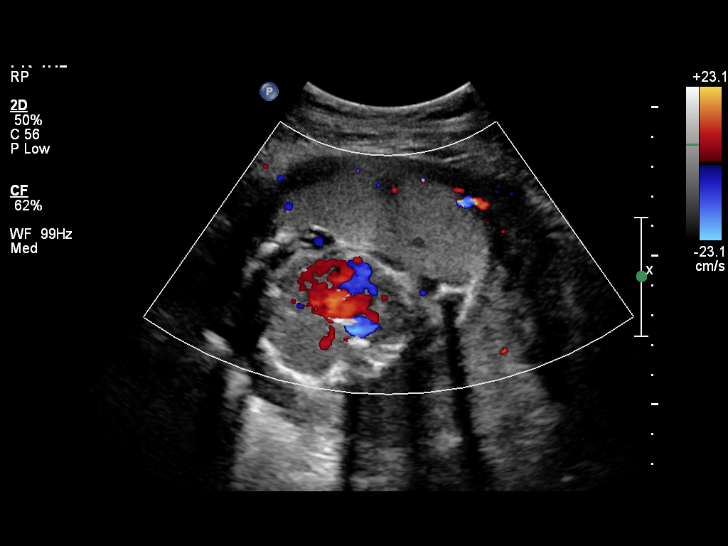
[im 15/22]
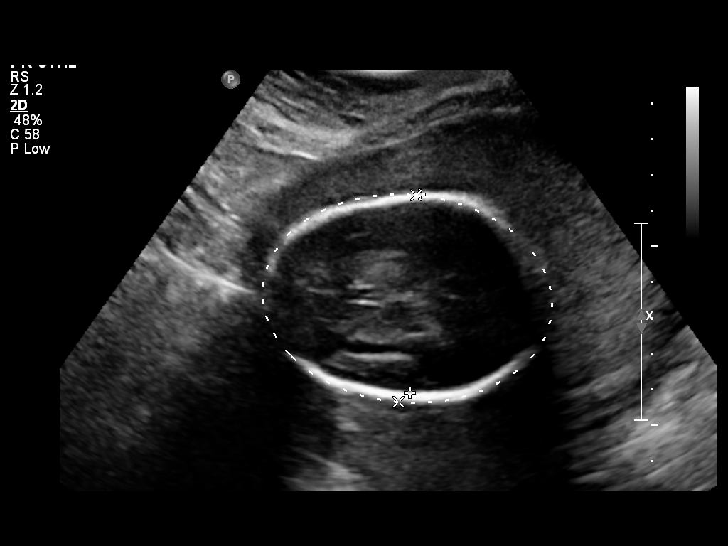
[im 17/22]
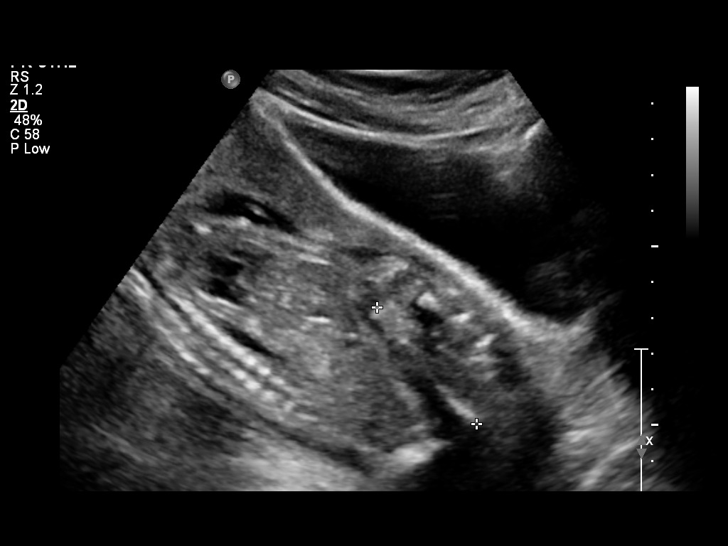
[im 18/22]
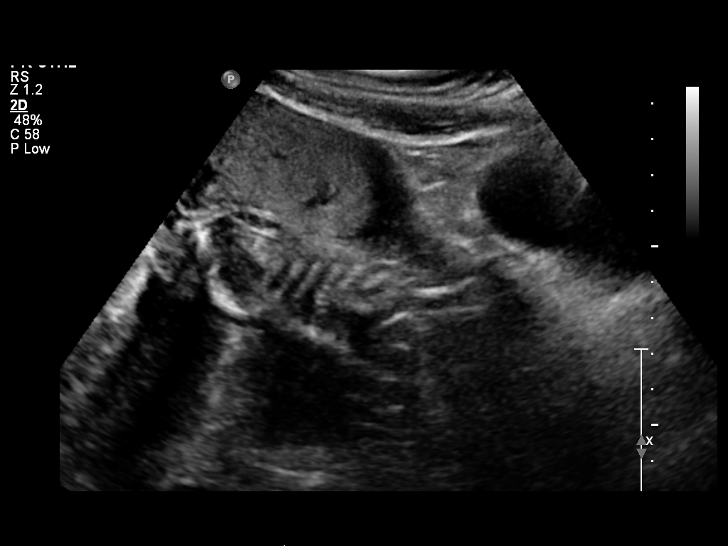
[im 20/22]
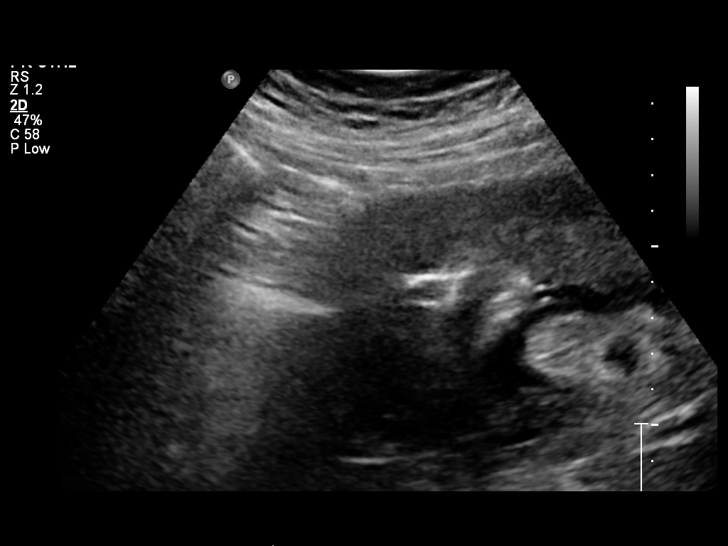
[im 22/22]
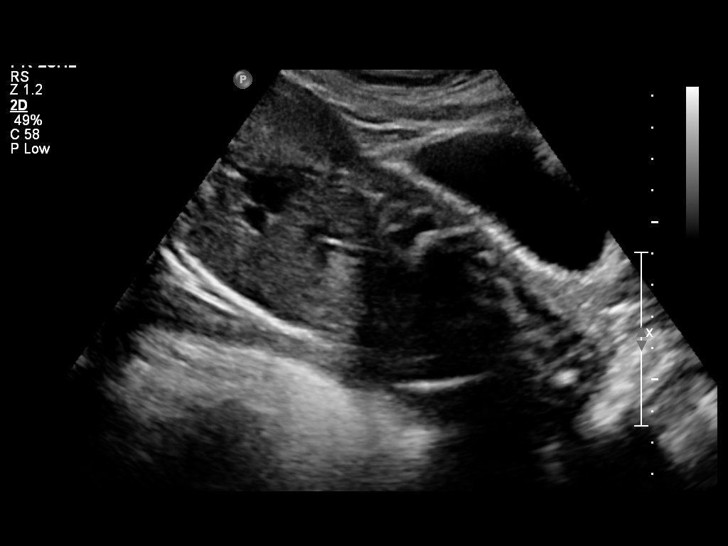

[13 of 22 positions shown; findings below may reference images not displayed]

OBSTETRICS REPORT
                    (Corrected Final 06/06/2012 [DATE])

Service(s) Provided

 US OB FOLLOW UP                                       76816.1
Indications

 Vaginal bleeding, unknown etiology
 Pain - Abdominal/Pelvic
 Determine fetal presentation using ultrasound
 Assess fetal weight
Fetal Evaluation

 Num Of Fetuses:    1
 Fetal Heart Rate:  133                          bpm
 Cardiac Activity:  Observed
 Presentation:      Breech
 Placenta:          Anterior, above cervical os

 Comment:    No placental abruption or previa identified.

 Amniotic Fluid
 AFI FV:      Anhydramnios
                                             Larg Pckt:       0  cm
Biometry

 BPD:     55.4  mm     G. Age:  22w 6d                CI:         64.9   70 - 86
                                                      FL/HC:      19.4   18.7 -

 HC:       221  mm     G. Age:  24w 1d       14  %    HC/AC:      1.17   1.04 -

 AC:     189.4  mm     G. Age:  23w 5d       15  %    FL/BPD:     77.3   71 - 87
 FL:      42.8  mm     G. Age:  24w 0d       18  %    FL/AC:      22.6   20 - 24

 Est. FW:     631  gm      1 lb 6 oz     32  %
Gestational Age

 U/S Today:     23w 5d                                        EDD:   09/27/12
 Best:          24w 5d     Det. By:  U/S (04/23/12)           EDD:   09/20/12
Cervix Uterus Adnexa

 Cervical Length:    0        cm
Impression

 Single living intrauterine pregnancy in breech presentation.
 Anhydramnios. Cervix dilated. Placenta fundal and normal in
 appearance. The estimated gestational age is 24w 5d based
 on U/S (04/23/12). Study performed in [facility]. Dr. Chi
 present. A preliminary report was given at the time of on-call
 interpretation.

 questions or concerns.
                 Attending Physician, HEVIA

## 2014-04-30 ENCOUNTER — Encounter (HOSPITAL_COMMUNITY): Payer: Self-pay | Admitting: *Deleted

## 2016-01-30 DIAGNOSIS — M25562 Pain in left knee: Secondary | ICD-10-CM | POA: Diagnosis not present

## 2016-03-12 DIAGNOSIS — L700 Acne vulgaris: Secondary | ICD-10-CM | POA: Diagnosis not present

## 2017-03-05 DIAGNOSIS — E669 Obesity, unspecified: Secondary | ICD-10-CM | POA: Diagnosis not present

## 2017-03-05 DIAGNOSIS — E785 Hyperlipidemia, unspecified: Secondary | ICD-10-CM | POA: Diagnosis not present

## 2017-03-05 DIAGNOSIS — Z01118 Encounter for examination of ears and hearing with other abnormal findings: Secondary | ICD-10-CM | POA: Diagnosis not present

## 2017-03-05 DIAGNOSIS — I10 Essential (primary) hypertension: Secondary | ICD-10-CM | POA: Diagnosis not present

## 2017-03-05 DIAGNOSIS — Z0000 Encounter for general adult medical examination without abnormal findings: Secondary | ICD-10-CM | POA: Diagnosis not present

## 2017-03-05 DIAGNOSIS — R51 Headache: Secondary | ICD-10-CM | POA: Diagnosis not present

## 2017-03-05 DIAGNOSIS — Z131 Encounter for screening for diabetes mellitus: Secondary | ICD-10-CM | POA: Diagnosis not present

## 2017-03-05 DIAGNOSIS — Z136 Encounter for screening for cardiovascular disorders: Secondary | ICD-10-CM | POA: Diagnosis not present

## 2017-06-04 ENCOUNTER — Other Ambulatory Visit: Payer: Self-pay

## 2017-06-04 ENCOUNTER — Emergency Department (HOSPITAL_COMMUNITY)
Admission: EM | Admit: 2017-06-04 | Discharge: 2017-06-04 | Disposition: A | Discharge status: Home / Self Care | Payer: Self-pay | Attending: Emergency Medicine | Admitting: Emergency Medicine

## 2017-06-04 ENCOUNTER — Encounter (HOSPITAL_COMMUNITY): Payer: Self-pay

## 2017-06-04 DIAGNOSIS — R51 Headache: Secondary | ICD-10-CM | POA: Insufficient documentation

## 2017-06-04 DIAGNOSIS — M25562 Pain in left knee: Secondary | ICD-10-CM | POA: Insufficient documentation

## 2017-06-04 DIAGNOSIS — R600 Localized edema: Secondary | ICD-10-CM | POA: Insufficient documentation

## 2017-06-04 DIAGNOSIS — Z79899 Other long term (current) drug therapy: Secondary | ICD-10-CM | POA: Insufficient documentation

## 2017-06-04 DIAGNOSIS — R609 Edema, unspecified: Secondary | ICD-10-CM

## 2017-06-04 DIAGNOSIS — I10 Essential (primary) hypertension: Secondary | ICD-10-CM | POA: Insufficient documentation

## 2017-06-04 DIAGNOSIS — R079 Chest pain, unspecified: Secondary | ICD-10-CM | POA: Insufficient documentation

## 2017-06-04 LAB — I-STAT CHEM 8, ED
BUN: 7 mg/dL (ref 6–20)
CALCIUM ION: 1.19 mmol/L (ref 1.15–1.40)
CHLORIDE: 101 mmol/L (ref 101–111)
Creatinine, Ser: 0.7 mg/dL (ref 0.44–1.00)
Glucose, Bld: 84 mg/dL (ref 65–99)
HEMATOCRIT: 37 % (ref 36.0–46.0)
Hemoglobin: 12.6 g/dL (ref 12.0–15.0)
POTASSIUM: 4.1 mmol/L (ref 3.5–5.1)
SODIUM: 141 mmol/L (ref 135–145)
TCO2: 28 mmol/L (ref 22–32)

## 2017-06-04 MED ORDER — ACETAMINOPHEN 325 MG PO TABS
650.0000 mg | ORAL_TABLET | Freq: Once | ORAL | Status: AC
  Administered 2017-06-04: 650 mg via ORAL
  Filled 2017-06-04: qty 2

## 2017-06-04 NOTE — ED Triage Notes (Signed)
Patient c/o left leg swelling and left leg pain x 6 months and gradually getting worse. Patient states it is hard to walk, but patient was walking in the triage area. Patient states she also has a frontal headache and intermittent chest pain x 6 months.

## 2017-06-04 NOTE — ED Provider Notes (Signed)
[facility]-EMERGENCY DEPT Provider Note   CSN: [id number] Arrival date & time: 06/04/17  1123     History   Chief Complaint Chief Complaint  Patient presents with  . Leg Pain  . Leg Swelling    HPI Lisa Pitts is a 41 y.o. female.  HPI Complaint of bilateral leg swelling for 6 months.  Swelling is worse at the end of the day and improved  when she wakes up in the morning she also complains of left anterior knee pain for several months.  She had been placed on diclofenac, HCTZ, amlodipine and another blood pressure medicine possibly atenolol by her PCP which she stopped taking a 1 or 2 weeks ago.  She reports her liver and kidney function were checked approximately 2 months ago which were normal.  She complains of intermittent chest pain for several months.  The last time she had chest pain was 1 month ago.  Also complains of occasional headache though no headache now.  Denies shortness of breath.  No other associated symptoms Past Medical History:  Diagnosis Date  . Allergy   . Chronic hypertension with exacerbation during pregnancy 06/06/2012  . Depression   . Dysfunctional family processes 06/06/2012  . Fracture of finger of right hand   . Maternal anemia complicating pregnancy, childbirth, or the puerperium 06/06/2012   Chronic IDA and superimposed ABL anemia  . Migraines   . Postpartum care following cesarean delivery 06/06/2012  . S/P emergency cesarean section PPROM 24 wks - 12/8 06/06/2012    Patient Active Problem List   Diagnosis Date Noted  . Fingers fractured 10/22/2010  . Depression with anxiety 10/22/2010    Past Surgical History:  Procedure Laterality Date  . CESAREAN SECTION  06/05/2012   Procedure: CESAREAN SECTION;  Surgeon: Tresa EndoKelly A. Ernestina PennaFogleman, MD;  Location: [facility];  Service: Obstetrics;  Laterality: N/A;    OB History    Gravida Para Term Preterm AB Living   5 2   1 3      SAB TAB Ectopic Multiple Live Births   1 2              Home Medications    Prior to Admission medications   Medication Sig Start Date End Date Taking? Authorizing Provider  cephALEXin (KEFLEX) 500 MG capsule Take 1 capsule (500 mg total) by mouth 4 (four) times daily. 11/26/12   Muthersbaugh, Dahlia ClientHannah, PA-C  ketoconazole (NIZORAL) 2 % cream Apply topically daily. 11/26/12   Muthersbaugh, Dahlia ClientHannah, PA-C  labetalol (NORMODYNE) 100 MG tablet Take 2 tablets (200 mg total) by mouth 3 (three) times daily. 06/08/12   Earl Galaavies, Denise, CNM  naproxen (NAPROSYN) 500 MG tablet Take 1 tablet (500 mg total) by mouth 2 (two) times daily with a meal. 11/26/12   Muthersbaugh, Dahlia ClientHannah, PA-C  neomycin-bacitracin-polymyxin (NEOSPORIN) ointment Apply 1 application topically 2 (two) times daily as needed (to wound).    [provider]  Prenatal Vit-Fe Fumarate-FA (MULTIVITAMIN-PRENATAL) 27-0.8 MG TABS Take 1 tablet by mouth daily at 12 noon.    [provider]  terbinafine (LAMISIL) 250 MG tablet Take 1 tablet (250 mg total) by mouth daily. For two weeks 11/26/12   Muthersbaugh, Dahlia ClientHannah, PA-C    Family History Family History  Problem Relation Age of Onset  . Kidney disease Maternal Grandmother        dialysis    Social History Social History   Tobacco Use  . Smoking status: Never Smoker  . Smokeless tobacco: Never  Used  Substance Use Topics  . Alcohol use: No  . Drug use: No     Allergies   Patient has no known allergies.   Review of Systems Review of Systems  Constitutional: Negative.   HENT: Negative.   Respiratory: Negative.   Cardiovascular: Positive for chest pain and leg swelling.  Gastrointestinal: Negative.   Musculoskeletal: Positive for arthralgias.       Chronic anterior left knee pain  Skin: Negative.   Neurological: Positive for headaches.  Psychiatric/Behavioral: Negative.   All other systems reviewed and are negative.    Physical Exam Updated Vital Signs BP 122/67 (BP Location: Left Arm)   Pulse 86   Temp  98.6 F (37 C) (Oral)   Resp 18   Ht 5\' 6"  (1.676 m)   Wt 117.9 kg (260 lb)   LMP 06/04/2017   SpO2 100%   BMI 41.97 kg/m   Physical Exam  Constitutional: She appears well-developed and well-nourished.  HENT:  Head: Normocephalic and atraumatic.  Eyes: Conjunctivae are normal. Pupils are equal, round, and reactive to light.  Neck: Neck supple. No tracheal deviation present. No thyromegaly present.  Cardiovascular: Normal rate and regular rhythm.  No murmur heard. Pulmonary/Chest: Effort normal and breath sounds normal.  Abdominal: Soft. Bowel sounds are normal. She exhibits no distension. There is no tenderness.  Obese  Musculoskeletal: Normal range of motion. She exhibits edema. She exhibits no tenderness.  1+ pretibial pitting edema bilaterally, no tenderness..  Left lower extremity not red warm or tender DP pulses 2+ bilaterally good capillary refill.  Upper extremities without edema.  Neurological: She is alert. Coordination normal.  Skin: Skin is warm and dry. No rash noted.  Psychiatric: She has a normal mood and affect.  Nursing note and vitals reviewed.    ED Treatments / Results  Labs (all labs ordered are listed, but only abnormal results are displayed) Labs Reviewed  I-STAT CHEM 8, ED    EKG  EKG Interpretation None       Radiology No results found.  Procedures Procedures (including critical care time)  Medications Ordered in ED Medications - No data to display  Results for orders placed or performed during the hospital encounter of 06/04/17  I-stat chem 8, ed  Result Value Ref Range   Sodium 141 135 - 145 mmol/L   Potassium 4.1 3.5 - 5.1 mmol/L   Chloride 101 101 - 111 mmol/L   BUN 7 6 - 20 mg/dL   Creatinine, Ser 8.110.70 0.44 - 1.00 mg/dL   Glucose, Bld 84 65 - 99 mg/dL   Calcium, Ion 9.141.19 7.821.15 - 1.40 mmol/L   TCO2 28 22 - 32 mmol/L   Hemoglobin 12.6 12.0 - 15.0 g/dL   HCT 95.637.0 21.336.0 - 08.646.0 %   No results found. Initial Impression /  Assessment and Plan / ED Course  I have reviewed the triage vital signs and the nursing notes.  Pertinent labs & imaging results that were available during my care of the patient were reviewed by me and considered in my medical decision making (see chart for details).     Patient walks without difficulty.  I had a lengthy discussion with patient about weight loss and exercise.  We will restart her on. Hydrochlorothiazide Tylenol for aches.  Follow-up with Dr. Julio Sickssei-Bonsu Final Clinical Impressions(s) / ED Diagnoses  Diagnosis chronic leg edema Final diagnoses:  None    ED Discharge Orders    None       Lisa Pitts,  Tabbetha Kutscher, MD 06/04/17 [id number]

## 2017-06-04 NOTE — Discharge Instructions (Signed)
Elevate your legs as much as possible above your heart.  Restart the hydrochlorothiazide as directed.  Take Tylenol for pain as directed.  Call Dr.Osei-Bonsu schedule an office visit and have a discussion with him to arrange for a diet and exercise program for you

## 2017-10-28 DIAGNOSIS — R5383 Other fatigue: Secondary | ICD-10-CM | POA: Diagnosis not present

## 2017-10-28 DIAGNOSIS — I10 Essential (primary) hypertension: Secondary | ICD-10-CM | POA: Diagnosis not present

## 2017-10-28 DIAGNOSIS — E559 Vitamin D deficiency, unspecified: Secondary | ICD-10-CM | POA: Diagnosis not present

## 2017-10-28 DIAGNOSIS — Z0000 Encounter for general adult medical examination without abnormal findings: Secondary | ICD-10-CM | POA: Diagnosis not present

## 2017-12-14 ENCOUNTER — Other Ambulatory Visit: Payer: Self-pay

## 2017-12-14 ENCOUNTER — Emergency Department (HOSPITAL_COMMUNITY)
Admission: EM | Admit: 2017-12-14 | Discharge: 2017-12-14 | Disposition: A | Discharge status: Home / Self Care | Payer: Medicaid Other | Attending: Emergency Medicine | Admitting: Emergency Medicine

## 2017-12-14 ENCOUNTER — Emergency Department (HOSPITAL_COMMUNITY): Payer: Medicaid Other

## 2017-12-14 ENCOUNTER — Encounter (HOSPITAL_COMMUNITY): Payer: Self-pay | Admitting: Emergency Medicine

## 2017-12-14 DIAGNOSIS — S60212A Contusion of left wrist, initial encounter: Secondary | ICD-10-CM | POA: Insufficient documentation

## 2017-12-14 DIAGNOSIS — I10 Essential (primary) hypertension: Secondary | ICD-10-CM | POA: Insufficient documentation

## 2017-12-14 DIAGNOSIS — Y929 Unspecified place or not applicable: Secondary | ICD-10-CM | POA: Insufficient documentation

## 2017-12-14 DIAGNOSIS — Y999 Unspecified external cause status: Secondary | ICD-10-CM | POA: Insufficient documentation

## 2017-12-14 DIAGNOSIS — W1789XA Other fall from one level to another, initial encounter: Secondary | ICD-10-CM | POA: Insufficient documentation

## 2017-12-14 DIAGNOSIS — S60211A Contusion of right wrist, initial encounter: Secondary | ICD-10-CM | POA: Insufficient documentation

## 2017-12-14 DIAGNOSIS — Y939 Activity, unspecified: Secondary | ICD-10-CM | POA: Insufficient documentation

## 2017-12-14 DIAGNOSIS — S60219A Contusion of unspecified wrist, initial encounter: Secondary | ICD-10-CM

## 2017-12-14 MED ORDER — IBUPROFEN 800 MG PO TABS
800.0000 mg | ORAL_TABLET | Freq: Once | ORAL | Status: AC
  Administered 2017-12-14: 800 mg via ORAL
  Filled 2017-12-14: qty 1

## 2017-12-14 MED ORDER — IBUPROFEN 600 MG PO TABS: 600.0000 mg | ORAL_TABLET | Freq: Three times a day (TID) | ORAL | 0 refills | Status: DC | PRN

## 2017-12-14 MED ORDER — BACITRACIN ZINC 500 UNIT/GM EX OINT
TOPICAL_OINTMENT | CUTANEOUS | Status: AC
  Filled 2017-12-14: qty 1.8

## 2017-12-14 NOTE — Discharge Instructions (Signed)
Return for any problem.  Follow-up with your regular doctor tomorrow as instructed.  Use ibuprofen as prescribed for pain.

## 2017-12-14 NOTE — ED Triage Notes (Addendum)
Patient c/o fall on Sunday with abrasion and pain to left knee. Reports hitting head. Denies LOC. Denies taking blood thinners. Also c/o left wrist pain. No deformity noted. Denies neck and back pain.

## 2017-12-14 NOTE — ED Provider Notes (Signed)
[facility]-EMERGENCY DEPT Provider Note   CSN: [id number] Arrival date & time: 12/14/17  1833     History   Chief Complaint Chief Complaint  Patient presents with  . Fall  . Knee Pain    HPI Lisa Pitts is a 42 y.o. female.  42 year old female without significant prior medical history presents with complaint of injury from fall.  She is fairly nonspecific with regard to the actual details of her fall.  A young family member present in the room suggested that her fall was secondary "to the police" - the patient then told her granddaughter to "be quiet."   She reports that her injury occurred on Sunday.  It appears to have been a fall from standing. She denies head injury or loss of consciousness.  She denies neck pain.  She complains of bilateral wrist pain.  She still complains of an abrasion to the anterior left knee.  She is ambulatory without difficulty.  She denies any other specific injury.  She has not taken anything at home for her symptoms.  It is unclear if her bilateral wrist pain is related to having been placed in hand-cuffs.   The history is provided by the patient.  Fall  This is a new problem. The current episode started 2 days ago. The problem occurs rarely. The problem has not changed since onset.Pertinent negatives include no chest pain. Nothing aggravates the symptoms. Nothing relieves the symptoms. She has tried nothing for the symptoms.  Knee Pain      Past Medical History:  Diagnosis Date  . Allergy   . Chronic hypertension with exacerbation during pregnancy 06/06/2012  . Depression   . Dysfunctional family processes 06/06/2012  . Fracture of finger of right hand   . Maternal anemia complicating pregnancy, childbirth, or the puerperium 06/06/2012   Chronic IDA and superimposed ABL anemia  . Migraines   . Postpartum care following cesarean delivery 06/06/2012  . S/P emergency cesarean section PPROM 24 wks - 12/8 06/06/2012     Patient Active Problem List   Diagnosis Date Noted  . Fingers fractured 10/22/2010  . Depression with anxiety 10/22/2010    Past Surgical History:  Procedure Laterality Date  . CESAREAN SECTION  06/05/2012   Procedure: CESAREAN SECTION;  Surgeon: Tresa Endo A. Ernestina Penna, MD;  Location: [facility];  Service: Obstetrics;  Laterality: N/A;     OB History    Gravida  5   Para  2   Term      Preterm  1   AB  3   Living        SAB  1   TAB  2   Ectopic      Multiple      Live Births               Home Medications    Prior to Admission medications   Medication Sig Start Date End Date Taking? Authorizing Provider  ibuprofen (ADVIL,MOTRIN) 600 MG tablet Take 1 tablet (600 mg total) by mouth every 8 (eight) hours as needed. 12/14/17   Wynetta Fines, MD    Family History Family History  Problem Relation Age of Onset  . Kidney disease Maternal Grandmother        dialysis    Social History Social History   Tobacco Use  . Smoking status: Never Smoker  . Smokeless tobacco: Never Used  Substance Use Topics  . Alcohol use: No  . Drug use: No  Allergies   Patient has no known allergies.   Review of Systems Review of Systems  Cardiovascular: Negative for chest pain.  All other systems reviewed and are negative.    Physical Exam Updated Vital Signs BP (!) 163/112 (BP Location: Right Wrist)   Pulse 90   Temp 98.4 F (36.9 C) (Oral)   Resp 18   LMP 12/06/2017   SpO2 100%   Physical Exam  Constitutional: She is oriented to person, place, and time. She appears well-developed and well-nourished. No distress.  HENT:  Head: Normocephalic and atraumatic.  Mouth/Throat: Oropharynx is clear and moist.  Eyes: Pupils are equal, round, and reactive to light. Conjunctivae and EOM are normal.  Neck: Normal range of motion. Neck supple.  Cardiovascular: Normal rate, regular rhythm and normal heart sounds.  Pulmonary/Chest: Effort normal and breath sounds  normal. No respiratory distress.  Abdominal: Soft. She exhibits no distension. There is no tenderness.  Musculoskeletal: Normal range of motion. She exhibits no edema or deformity.  Bilateral wrist with mild edema and nonspecific tenderness.  No appreciable bony step-off.  Full active range of motion of both wrist.  No snuffbox tenderness - bilaterally.   Bilateral knees are nontender with full active range of motion.  Small abrasion noted to anterior left knee. Normal gait noted.   Neurological: She is alert and oriented to person, place, and time.  Skin: Skin is warm and dry.  Psychiatric: She has a normal mood and affect.  Nursing note and vitals reviewed.    ED Treatments / Results  Labs (all labs ordered are listed, but only abnormal results are displayed) Labs Reviewed - No data to display  EKG None  Radiology Dg Wrist Complete Left  Result Date: 12/14/2017 CLINICAL DATA:  Dorsal wrist pain after fall. EXAM: LEFT WRIST - COMPLETE 3+ VIEW COMPARISON:  None. FINDINGS: There is no evidence of fracture or dislocation. Overlap of the trapezoid and trapezium is noted on the PA views limiting assessment. There is no evidence of arthropathy or other focal bone abnormality. Mild soft tissue swelling over the dorsum of the included distal forearm and wrist. IMPRESSION: Soft tissue swelling without acute displaced fracture or joint dislocation. Electronically Signed   By: Tollie Ethavid  Kwon M.D.   On: 12/14/2017 22:35   Dg Wrist Complete Right  Result Date: 12/14/2017 CLINICAL DATA:  Posterior wrist pain after fall on Sunday. EXAM: RIGHT WRIST - COMPLETE 3+ VIEW COMPARISON:  None. FINDINGS: There is mild widening of the space between the triquetrum and hamate bones but this is likely due to nonstandard positioning. No definite evidence of any acute fracture or dislocation. Soft tissues are unremarkable. IMPRESSION: Widening of the space between the triquetrum and hamate bones, likely due to  positioning. No definite evidence of acute fracture or dislocation. Electronically Signed   By: Burman NievesWilliam  Stevens M.D.   On: 12/14/2017 22:40    Procedures Procedures (including critical care time)  Medications Ordered in ED Medications  ibuprofen (ADVIL,MOTRIN) tablet 800 mg (800 mg Oral Given 12/14/17 2214)     Initial Impression / Assessment and Plan / ED Course  I have reviewed the triage vital signs and the nursing notes.  Pertinent labs & imaging results that were available during my care of the patient were reviewed by me and considered in my medical decision making (see chart for details).     MDM  Screen complete  Presenting for evaluation following a reported fall -- possibly secondary to an encounter with law enforcement.  No evidence of significant acute traumatic injury. Bilateral wrist radiographs are without evidence of significant bony abnormality.   Patient understands need for close follow up. Strict return precautions given and understood.   Final Clinical Impressions(s) / ED Diagnoses   Final diagnoses:  Contusion of wrist, unspecified laterality, initial encounter    ED Discharge Orders        Ordered    ibuprofen (ADVIL,MOTRIN) 600 MG tablet  Every 8 hours PRN     12/14/17 2248       Wynetta Fines, MD 12/14/17 2257

## 2018-02-18 ENCOUNTER — Emergency Department (HOSPITAL_COMMUNITY)
Admission: EM | Admit: 2018-02-18 | Discharge: 2018-02-18 | Disposition: A | Discharge status: Home / Self Care | Payer: Medicaid Other | Attending: Emergency Medicine | Admitting: Emergency Medicine

## 2018-02-18 ENCOUNTER — Emergency Department (HOSPITAL_BASED_OUTPATIENT_CLINIC_OR_DEPARTMENT_OTHER): Payer: Medicaid Other

## 2018-02-18 ENCOUNTER — Other Ambulatory Visit: Payer: Self-pay

## 2018-02-18 ENCOUNTER — Encounter (HOSPITAL_COMMUNITY): Payer: Self-pay

## 2018-02-18 ENCOUNTER — Emergency Department (HOSPITAL_COMMUNITY): Payer: Medicaid Other

## 2018-02-18 DIAGNOSIS — R200 Anesthesia of skin: Secondary | ICD-10-CM | POA: Insufficient documentation

## 2018-02-18 DIAGNOSIS — M79605 Pain in left leg: Secondary | ICD-10-CM

## 2018-02-18 DIAGNOSIS — R51 Headache: Secondary | ICD-10-CM | POA: Insufficient documentation

## 2018-02-18 DIAGNOSIS — R519 Headache, unspecified: Secondary | ICD-10-CM

## 2018-02-18 DIAGNOSIS — M25572 Pain in left ankle and joints of left foot: Secondary | ICD-10-CM | POA: Insufficient documentation

## 2018-02-18 DIAGNOSIS — R609 Edema, unspecified: Secondary | ICD-10-CM

## 2018-02-18 DIAGNOSIS — Z79899 Other long term (current) drug therapy: Secondary | ICD-10-CM | POA: Insufficient documentation

## 2018-02-18 MED ORDER — NAPROXEN 375 MG PO TABS: 375.0000 mg | ORAL_TABLET | Freq: Two times a day (BID) | ORAL | 0 refills | Status: AC

## 2018-02-18 MED ORDER — IBUPROFEN 400 MG PO TABS
600.0000 mg | ORAL_TABLET | Freq: Once | ORAL | Status: AC
  Administered 2018-02-18: 600 mg via ORAL
  Filled 2018-02-18: qty 1

## 2018-02-18 MED ORDER — NAPROXEN 250 MG PO TABS
500.0000 mg | ORAL_TABLET | Freq: Once | ORAL | Status: AC
  Administered 2018-02-18: 500 mg via ORAL
  Filled 2018-02-18: qty 2

## 2018-02-18 NOTE — Discharge Instructions (Addendum)
Follow-up with your primary care doctor in 1 week for reevaluation.  Return to the emergency department symptoms worsen, numbness, weakness or any other concerning symptoms.  Take naproxen only as prescribed.  Do not take ibuprofen or Advil other NSAIDs while taking this medication.  May also take Tylenol as previously instructed following dosing instructions on packaging.  Contact the above number for follow-up with neurology if he would like for recurrent headaches and left thumb tingling.

## 2018-02-18 NOTE — ED Notes (Signed)
Pt transported to vascular study  

## 2018-02-18 NOTE — Progress Notes (Signed)
Left lower extremity venous duplex has been completed. Negative for DVT. Results were given to Lyndel SafeElizabeth Hammond PA.  02/18/18 3:41 PM Olen CordialGreg Shantese Raven RVT

## 2018-02-18 NOTE — ED Provider Notes (Signed)
[facility] EMERGENCY DEPARTMENT Provider Note   CSN: [id number] Arrival date & time: 02/18/18  1255     History   Chief Complaint No chief complaint on file. CC: left thumb tingling, left knee and ankle pain, recurrent headaches  HPI Lisa Pitts is a 42 y.o. female.  HPI 42 year old female with history of obesity, hypertension, depression, recurrent migraines presents to the emergency department today for evaluation of multiple complaints.  Patient states that for the past several months she has been having achiness in both her left knee and left ankle.  States it is worse with walking or standing for long times.  It is better when she wears tennis shoes and worse when she wears dress shoes.  She works on a Animator all day typically.  States she knows she needs to lose weight but has been unable to.  No falls or traumas however review of record shows in June patient did have a fall for which she was evaluated in the emergency department.  No fevers or chills.  No joint swelling.  Does have tingling initially described as numbness in her left thumb but no other focal neurological deficits.  Has been having recurrent headaches in the frontal region for the past several months but denies active headache at this time.  States she is really mostly concerned about her left leg pain here today.  No rashes.   Past Medical History:  Diagnosis Date  . Allergy   . Chronic hypertension with exacerbation during pregnancy 06/06/2012  . Depression   . Dysfunctional family processes 06/06/2012  . Fracture of finger of right hand   . Maternal anemia complicating pregnancy, childbirth, or the puerperium 06/06/2012   Chronic IDA and superimposed ABL anemia  . Migraines   . Postpartum care following cesarean delivery 06/06/2012  . S/P emergency cesarean section PPROM 24 wks - 12/8 06/06/2012    Patient Active Problem List   Diagnosis Date Noted  . Fingers fractured 10/22/2010  .  Depression with anxiety 10/22/2010    Past Surgical History:  Procedure Laterality Date  . CESAREAN SECTION  06/05/2012   Procedure: CESAREAN SECTION;  Surgeon: Tresa Endo A. Ernestina Penna, MD;  Location: [facility];  Service: Obstetrics;  Laterality: N/A;     OB History    Gravida  5   Para  2   Term      Preterm  1   AB  3   Living        SAB  1   TAB  2   Ectopic      Multiple      Live Births               Home Medications    Prior to Admission medications   Medication Sig Start Date End Date Taking? Authorizing Provider  adapalene (DIFFERIN) 0.1 % cream Apply 1 application topically at bedtime.   Yes [provider]  Dapsone (ACZONE EX) Apply 1 application topically daily.   Yes [provider]  ibuprofen (ADVIL,MOTRIN) 200 MG tablet Take 200 mg by mouth every 6 (six) hours as needed.   Yes [provider]  ibuprofen (ADVIL,MOTRIN) 600 MG tablet Take 1 tablet (600 mg total) by mouth every 8 (eight) hours as needed. Patient not taking: Reported on 02/18/2018 12/14/17   Wynetta Fines, MD  naproxen (NAPROSYN) 375 MG tablet Take 1 tablet (375 mg total) by mouth 2 (two) times daily for 5 days. 02/18/18 02/23/18  Rigoberto Noel, MD    Family History Family History  Problem Relation Age of Onset  . Kidney disease Maternal Grandmother        dialysis    Social History Social History   Tobacco Use  . Smoking status: Never Smoker  . Smokeless tobacco: Never Used  Substance Use Topics  . Alcohol use: No  . Drug use: No     Allergies   Patient has no known allergies.   Review of Systems Review of Systems  Constitutional: Negative for chills and fever.  HENT: Negative for congestion and sore throat.   Eyes: Negative for visual disturbance.  Respiratory: Negative for cough and shortness of breath.   Cardiovascular: Negative for chest pain and leg swelling.  Gastrointestinal: Negative for abdominal pain, diarrhea, nausea and vomiting.    Genitourinary: Negative for dysuria and hematuria.  Musculoskeletal: Positive for arthralgias (aching left knee and ankle). Negative for back pain and neck pain.  Skin: Negative for color change and rash.  Neurological: Positive for headaches (not active). Negative for weakness.       Tingling left thumb  All other systems reviewed and are negative.    Physical Exam Updated Vital Signs BP (!) 147/79   Pulse 83   Temp 98.6 F (37 C) (Oral)   Resp 19   LMP 02/05/2018 (Exact Date)   SpO2 100%   Physical Exam  Constitutional: She appears well-developed and well-nourished. No distress.  obese  HENT:  Head: Normocephalic and atraumatic.  Eyes: Conjunctivae are normal.  Neck: Neck supple.  Cardiovascular: Regular rhythm and intact distal pulses.  Pulmonary/Chest: Effort normal and breath sounds normal. No respiratory distress.  Abdominal: Soft. She exhibits no distension. There is no tenderness.  Musculoskeletal: She exhibits no edema.  No hand/thenar atrophy. 2+ symmetric radial pulses bilat. Hands atrumatic. Neurovasc intact.   BLE's symmetric in appearance. Full ROM at both knee and ankle. 2+ DP's bilaterally  Neurological: She is alert.  Alert and oriented x3.  Cranial nerves II-XII intact.  No facial asymmetry.  5/5 grip strength bilaterally.  5/5 bicep flexion and tricep extension bilaterally. 5/5 flexion/extension at knee, hip, and ankles. No discoordination of FNF, heel to shin, and ambulates without ataxia or antalgic gait.    Skin: Skin is warm and dry. Capillary refill takes less than 2 seconds.  Psychiatric: She has a normal mood and affect.  Nursing note and vitals reviewed.    ED Treatments / Results  Labs (all labs ordered are listed, but only abnormal results are displayed) Labs Reviewed - No data to display  EKG None  Radiology Dg Ankle Complete Left  Result Date: 02/18/2018 CLINICAL DATA:  Anterior left ankle pain and swelling for 6 months. No known  injury. EXAM: LEFT ANKLE COMPLETE - 3+ VIEW COMPARISON:  None. FINDINGS: There is no evidence of fracture, dislocation, or joint effusion. Tiny plantar calcaneal spur is seen. There is some tibiotalar degenerative change most notable about the medial malleolus and talus. Soft tissues are unremarkable. IMPRESSION: No acute abnormality. Mild tibiotalar degenerative change. Tiny plantar calcaneal spur. Electronically Signed   By: Drusilla Kanner M.D.   On: 02/18/2018 14:53   Dg Knee Complete 4 Views Left  Result Date: 02/18/2018 CLINICAL DATA:  Anterior left knee pain for 6 months. No known injury. EXAM: LEFT KNEE - COMPLETE 4+ VIEW COMPARISON:  None. FINDINGS: No acute bony or joint abnormality is identified. There is mild medial compartment joint space narrowing. Small osteophytes are identified about  the knee. No joint effusion. No focal bony lesion. IMPRESSION: No acute abnormality. Mild appearing osteoarthritis most notable in the medial compartment. Electronically Signed   By: Drusilla Kannerhomas  Dalessio M.D.   On: 02/18/2018 14:52    Procedures Procedures (including critical care time)  Medications Ordered in ED Medications  naproxen (NAPROSYN) tablet 500 mg (has no administration in time range)  ibuprofen (ADVIL,MOTRIN) tablet 600 mg (600 mg Oral Given 02/18/18 1320)     Initial Impression / Assessment and Plan / ED Course  I have reviewed the triage vital signs and the nursing notes.  Pertinent labs & imaging results that were available during my care of the patient were reviewed by me and considered in my medical decision making (see chart for details).  Clinical Course as of Feb 18 1658  Fri Feb 18, 2018  1540 Negative for DVT per Vascular   [EH]    Clinical Course User Index [EH] Cristina GongHammond, Elizabeth W, PA-C   42 year old female with history of obesity, hypertension, depression, recurrent migraines presents to the emergency department today for evaluation of multiple complaints including  left leg aching at knee and ankle, several months tingling in left thumb, and recurrent frontal headaches for about 6 months (though no active HA at this time).   Pt afebrile, HDS, non-toxic appearing. Exam as detailed above. She has no HA today and no focal neuro deficits (besides tingling in left thumb). No findings at this time to suggest mass, bleed [facility](SAH, EDH, SDH), no evidence meningitis. Likely her recurrent migraines. Pain free at this time therefore do not feel patient requires any emergent imaging. Left thumb tingling could be related to repetitive movements of typing all day at work or carpal tunnel syndrome. Given recurrent HA's and left thumb tingling, will advise to f/u with neurology and will product contact info for this.   Xrays of left knee and ankle obtained in quick look reviewed with no fracture or malalignment. Does have findings of OA. Has no joint swelling or warmth or erythema, still able to ambulate. Not consistent with septic arthritis. BLEs symmetric in appearance. DVT ultrasound obtained in quick look with no DVT. Pt counseled on weight management and exercise as I think this could help improve her pain. Also discussed supportive shoes/insoles. Discussed tylenol/NSAIDs. Advised to f/u PCP.   Case and plan of care discussed with Dr. Juleen ChinaKohut.  Final Clinical Impressions(s) / ED Diagnoses   Final diagnoses:  Pain of left lower extremity  Recurrent headache    ED Discharge Orders         Ordered    naproxen (NAPROSYN) 375 MG tablet  2 times daily     02/18/18 1654           Rigoberto Noelickens, Geneveive Furness, MD 02/18/18 1659    Raeford RazorKohut, Stephen, MD 02/24/18 1426

## 2018-02-18 NOTE — ED Triage Notes (Signed)
Patient in x-ray - notified that patient has been roomed to D36 - transport to take her to room.

## 2018-02-18 NOTE — ED Provider Notes (Signed)
Patient placed in Quick Look pathway, seen and evaluated   Chief Complaint: Leg swelling in bilateral ankles, primarily left lower leg for years.  Also numbness of left thumb for two months, and frontal headache for months.    HPI:   Two months of numbness of the right thumb being numb.  Frontal headache for 6 months.  Left lower leg is 10/10 pain in left lower leg, anteriorly, none in back, from knee down.   ROS: No fevers, no travel.    Physical Exam:   Gen: No distress  Neuro: Awake and Alert  Skin: Warm    Focused Exam: Left leg is questionably swollen.     Initiation of care has begun. The patient has been counseled on the process, plan, and necessity for staying for the completion/evaluation, and the remainder of the medical screening examination    Norman ClayHammond, Niala Stcharles W, PA-C 02/18/18 1318    Sabas SousBero, Michael M, MD 02/18/18 [phone]

## 2018-02-18 NOTE — ED Triage Notes (Signed)
Pt presents with frontal headache for months that is intermittent, does not take any medication for pain; reports numbness to L thumb for 2 months and L leg pain "for years" that has been swelling from knee to ankle.

## 2018-03-11 DIAGNOSIS — I10 Essential (primary) hypertension: Secondary | ICD-10-CM | POA: Diagnosis not present

## 2018-03-11 DIAGNOSIS — J069 Acute upper respiratory infection, unspecified: Secondary | ICD-10-CM | POA: Diagnosis not present

## 2018-03-21 ENCOUNTER — Other Ambulatory Visit (HOSPITAL_COMMUNITY): Payer: Self-pay | Admitting: Family Medicine

## 2018-03-21 DIAGNOSIS — I10 Essential (primary) hypertension: Secondary | ICD-10-CM

## 2018-03-24 ENCOUNTER — Telehealth: Payer: Self-pay | Admitting: Physician Assistant

## 2018-03-24 NOTE — Telephone Encounter (Signed)
Patient contacted the after hour answering service asking questions about echo tomorrow and cardiology appointment for chest pain. I advised to the patient to seek medical attention if has active chest pain, otherwise, if she wants a cardiology appointment, she will need to talk to her primary care provider for referral.  Signed, Azalee Course PA Pager: [phone]

## 2018-03-25 ENCOUNTER — Other Ambulatory Visit (HOSPITAL_COMMUNITY): Payer: Medicaid Other

## 2018-04-08 ENCOUNTER — Other Ambulatory Visit (HOSPITAL_COMMUNITY): Payer: Medicaid Other

## 2018-04-20 ENCOUNTER — Encounter: Payer: Self-pay | Admitting: Family Medicine

## 2018-07-12 ENCOUNTER — Ambulatory Visit: Payer: Medicaid Other | Admitting: Cardiology

## 2019-01-11 ENCOUNTER — Other Ambulatory Visit: Payer: Self-pay | Admitting: Internal Medicine

## 2019-01-11 DIAGNOSIS — Z20822 Contact with and (suspected) exposure to covid-19: Secondary | ICD-10-CM

## 2019-01-14 LAB — NOVEL CORONAVIRUS, NAA: SARS-CoV-2, NAA: NOT DETECTED

## 2019-03-21 ENCOUNTER — Other Ambulatory Visit: Payer: Self-pay

## 2019-03-21 ENCOUNTER — Encounter (HOSPITAL_COMMUNITY): Payer: Self-pay | Admitting: Emergency Medicine

## 2019-03-21 DIAGNOSIS — M19071 Primary osteoarthritis, right ankle and foot: Secondary | ICD-10-CM | POA: Diagnosis not present

## 2019-03-21 DIAGNOSIS — M25571 Pain in right ankle and joints of right foot: Secondary | ICD-10-CM | POA: Diagnosis not present

## 2019-03-21 DIAGNOSIS — M19072 Primary osteoarthritis, left ankle and foot: Secondary | ICD-10-CM | POA: Diagnosis not present

## 2019-03-21 DIAGNOSIS — R600 Localized edema: Secondary | ICD-10-CM | POA: Diagnosis not present

## 2019-03-21 DIAGNOSIS — R2243 Localized swelling, mass and lump, lower limb, bilateral: Secondary | ICD-10-CM | POA: Diagnosis not present

## 2019-03-21 NOTE — ED Notes (Signed)
Pt became upset that she was not getting a room when called for vitals. Pt is sitting outside with her children and asked this writer how to know when she is being called. Pt was informed that we do not go outside looking for pts due to the amount of pts we see. Pt became upset with this information and pt was told that we do call them 3 times top be sure the pt has left.

## 2019-03-21 NOTE — ED Triage Notes (Signed)
Patient here from home with complaint of bilateral leg and ankle pain and swelling that started over a month ago.

## 2019-03-22 ENCOUNTER — Emergency Department (HOSPITAL_COMMUNITY): Payer: BC Managed Care – PPO

## 2019-03-22 ENCOUNTER — Emergency Department (HOSPITAL_COMMUNITY)
Admission: EM | Admit: 2019-03-22 | Discharge: 2019-03-22 | Disposition: A | Discharge status: Home / Self Care | Payer: BC Managed Care – PPO | Attending: Emergency Medicine | Admitting: Emergency Medicine

## 2019-03-22 DIAGNOSIS — R609 Edema, unspecified: Secondary | ICD-10-CM

## 2019-03-22 DIAGNOSIS — M19071 Primary osteoarthritis, right ankle and foot: Secondary | ICD-10-CM

## 2019-03-22 DIAGNOSIS — M25571 Pain in right ankle and joints of right foot: Secondary | ICD-10-CM | POA: Diagnosis not present

## 2019-03-22 DIAGNOSIS — M19072 Primary osteoarthritis, left ankle and foot: Secondary | ICD-10-CM

## 2019-03-22 MED ORDER — IBUPROFEN 200 MG PO TABS
600.0000 mg | ORAL_TABLET | Freq: Once | ORAL | Status: AC
  Administered 2019-03-22: 600 mg via ORAL
  Filled 2019-03-22: qty 3

## 2019-03-22 MED ORDER — DICLOFENAC SODIUM 1 % TD GEL: 2.0000 g | Freq: Four times a day (QID) | TRANSDERMAL | 0 refills | Status: AC

## 2019-03-22 NOTE — Discharge Instructions (Signed)
Thank you for allowing me to care for you today in the Emergency Department.   You have arthritis in your ankles.  Try to elevate your legs at or above the level of your heart when you are sitting.  Apply a thin layer of diclofenac gel to areas that are painful once every 6 hours as needed. You can take 650 mg of Tylenol once every 6 hours for pain control.  You need to follow-up with your primary care provider to have your blood pressure rechecked and get restarted on your blood pressure medications because her blood pressure was high today.  Return to the emergency department if you develop respiratory distress, high fevers, if your joints get red or hot to the touch, if you develop chest pain, or other new, concerning symptoms.

## 2019-03-22 NOTE — ED Provider Notes (Signed)
[facility]-EMERGENCY DEPT Provider Note   CSN: [id number] Arrival date & time: 03/21/19  2028     History   Chief Complaint Chief Complaint  Patient presents with  . Leg Swelling  . Ankle Pain    HPI Lisa Pitts is a 43 y.o. female with a history of chronic hypertension who presents to the emergency department with a chief complaint of bilateral ankle swelling.  The patient reports that she has been having bilateral ankle swelling for months.  She reports that the swelling has become so severe that she is having difficulty walking.  She reports pain in her bilateral ankles, right greater than left.  She reports that she works at a job where she sits most of the day.  She is not on her feet for long periods of time in her home.  States that she tried to walk in heels earlier this week and it was "agonizing". she denies shortness of breath, abdominal distention, numbness, weakness, overlying redness or warmth to the joints, fever, or chills.  No recent falls or injuries.  She reports that she has a history of hypertension, but stopped taking her medication so long ago that she cannot even remember what medication she was previously on.  She denies headache, difficulty voiding, chest pain, or abdominal pain.     The history is provided by the patient. No language interpreter was used.    Past Medical History:  Diagnosis Date  . Allergy   . Chronic hypertension with exacerbation during pregnancy 06/06/2012  . Depression   . Dysfunctional family processes 06/06/2012  . Fracture of finger of right hand   . Maternal anemia complicating pregnancy, childbirth, or the puerperium 06/06/2012   Chronic IDA and superimposed ABL anemia  . Migraines   . Postpartum care following cesarean delivery 06/06/2012  . S/P emergency cesarean section PPROM 24 wks - 12/8 06/06/2012    Patient Active Problem List   Diagnosis Date Noted  . Fingers fractured 10/22/2010  .  Depression with anxiety 10/22/2010    Past Surgical History:  Procedure Laterality Date  . CESAREAN SECTION  06/05/2012   Procedure: CESAREAN SECTION;  Surgeon: Tresa Endo A. Ernestina Penna, MD;  Location: [facility];  Service: Obstetrics;  Laterality: N/A;     OB History    Gravida  5   Para  2   Term      Preterm  1   AB  3   Living        SAB  1   TAB  2   Ectopic      Multiple      Live Births               Home Medications    Prior to Admission medications   Medication Sig Start Date End Date Taking? Authorizing Provider  diclofenac sodium (VOLTAREN) 1 % GEL Apply 2 g topically 4 (four) times daily. 03/22/19   Delissa Silba A, PA-C    Family History Family History  Problem Relation Age of Onset  . Kidney disease Maternal Grandmother        dialysis    Social History Social History   Tobacco Use  . Smoking status: Never Smoker  . Smokeless tobacco: Never Used  Substance Use Topics  . Alcohol use: No  . Drug use: No     Allergies   Patient has no known allergies.   Review of Systems Review of Systems  Constitutional: Negative for activity  change, chills, diaphoresis and fever.  Respiratory: Negative for cough and shortness of breath.   Cardiovascular: Positive for leg swelling. Negative for chest pain and palpitations.  Gastrointestinal: Negative for abdominal pain, diarrhea, nausea and vomiting.  Genitourinary: Negative for dysuria and vaginal bleeding.  Musculoskeletal: Negative for back pain, myalgias, neck pain and neck stiffness.  Skin: Negative for rash.  Allergic/Immunologic: Negative for immunocompromised state.  Neurological: Negative for dizziness, seizures, syncope, weakness, numbness and headaches.  Psychiatric/Behavioral: Negative for confusion.     Physical Exam Updated Vital Signs BP (!) 178/104 (BP Location: Right Wrist)   Pulse 97   Temp 99 F (37.2 C) (Oral)   Resp 17   Ht 5\' 4"  (1.626 m)   Wt 123.3 kg   SpO2 100%   BMI  46.67 kg/m   Physical Exam Vitals signs and nursing note reviewed.  Constitutional:      General: She is not in acute distress.    Appearance: She is obese. She is not ill-appearing, toxic-appearing or diaphoretic.     Comments: Morbidly obese  HENT:     Head: Normocephalic.  Eyes:     Conjunctiva/sclera: Conjunctivae normal.  Neck:     Musculoskeletal: Neck supple.  Cardiovascular:     Rate and Rhythm: Normal rate and regular rhythm.     Heart sounds: No murmur. No friction rub. No gallop.   Pulmonary:     Effort: Pulmonary effort is normal. No respiratory distress.     Breath sounds: No stridor. No wheezing, rhonchi or rales.  Chest:     Chest wall: No tenderness.  Abdominal:     General: There is no distension.     Palpations: Abdomen is soft. There is no mass.     Tenderness: There is no abdominal tenderness. There is no right CVA tenderness, left CVA tenderness, guarding or rebound.     Hernia: No hernia is present.  Musculoskeletal:     Comments: Mild 1+ pitting edema noted circumferentially to the bilateral ankles.  Trace nonpitting edema noted to the bilateral calves.  No overlying redness or warmth to the joints of the bilateral lower extremities.  She is neurovascularly intact.  Antalgic gait.  Skin:    General: Skin is warm.     Capillary Refill: Capillary refill takes less than 2 seconds.     Findings: No rash.  Neurological:     Mental Status: She is alert.  Psychiatric:        Behavior: Behavior normal.      ED Treatments / Results  Labs (all labs ordered are listed, but only abnormal results are displayed) Labs Reviewed - No data to display  EKG None  Radiology Dg Ankle Complete Right  Result Date: 03/22/2019 CLINICAL DATA:  Right ankle pain and swelling. No known injury. Pain for 1 month. EXAM: RIGHT ANKLE - COMPLETE 3+ VIEW COMPARISON:  None. FINDINGS: There is no evidence of fracture, dislocation, or joint effusion. Mild degenerative tibial talar  spurring. Ankle mortise is preserved. No bony destructive change or osseous erosions. Medial soft tissue edema versus habitus. IMPRESSION: Mild degenerative change in the tibiotalar joint. No acute osseous abnormality. Electronically Signed   By: Keith Rake M.D.   On: 03/22/2019 04:00    Procedures Procedures (including critical care time)  Medications Ordered in ED Medications  ibuprofen (ADVIL) tablet 600 mg (600 mg Oral Given 03/22/19 0405)     Initial Impression / Assessment and Plan / ED Course  I have reviewed the  triage vital signs and the nursing notes.  Pertinent labs & imaging results that were available during my care of the patient were reviewed by me and considered in my medical decision making (see chart for details).         43 year old female history of chronic hypertension presenting with chronic swelling to the bilateral ankles.  Per chart review, it appears that she has been seen and evaluated in the ER for similar complaints over the last few years.  Physical exam is unremarkable.  Patient is noted to have antalgic limping gait in the exam room.  Predominantly, her pain seems more localized in her right ankle.  X-ray with mild degenerative changes in the tibiotalar joint, which appears similar to previous x-ray of the left ankle.  Discussed with the patient that she has some underlying osteoarthritis.  I have a low suspicion for acute heart failure, cellulitis, gout, septic joint, or DVT.  I offer the patient ibuprofen for pain, but she requested "something stronger than ibuprofen or Tylenol."  Discussed that this was not indicated at this time.  She then states that she needs to be written out of work long-term and feels that she needs to be bedbound due to her pain.  Recommended the patient follow-up with primary care as she also needs her blood pressure rechecked and restarted on blood pressure medication.  Discussed body habitus was also likely contributing to  her symptoms.  She has been requesting to be written out of work for at least 1 week.  Discussed that as there is no medical reason that this is indicated at this time.  Discussed that I will write her out of work for the next 2 days so that she can follow-up with primary care to get her blood pressure rechecked.  She has no signs or symptoms of hypertensive urgency or emergency.  Prior to discharge, the patient was found to be ambulating independently multiple times in hallway.  Gait was normal and not antalgic.  She appeared to be in no acute distress and independently ambulated out of the department with a steady gait.  Final Clinical Impressions(s) / ED Diagnoses   Final diagnoses:  Osteoarthritis of both ankles, unspecified osteoarthritis type  Peripheral edema    ED Discharge Orders         Ordered    diclofenac sodium (VOLTAREN) 1 % GEL  4 times daily     03/22/19 0407           Revonda Menter A, PA-C 03/22/19 1696    Gilda Crease, MD 03/23/19 [phone]

## 2019-03-22 NOTE — ED Notes (Signed)
Pt given coke and menstrual pads per request

## 2019-04-18 ENCOUNTER — Ambulatory Visit: Payer: BC Managed Care – PPO | Admitting: Cardiology

## 2019-04-18 ENCOUNTER — Ambulatory Visit: Payer: BC Managed Care – PPO | Admitting: Family

## 2019-04-18 DIAGNOSIS — M545 Low back pain, unspecified: Secondary | ICD-10-CM | POA: Insufficient documentation

## 2019-04-18 DIAGNOSIS — I10 Essential (primary) hypertension: Secondary | ICD-10-CM | POA: Insufficient documentation

## 2019-04-18 DIAGNOSIS — J309 Allergic rhinitis, unspecified: Secondary | ICD-10-CM | POA: Insufficient documentation

## 2019-05-04 IMAGING — DX DG WRIST COMPLETE 3+V*R*
3 series · 3 of 3 positions shown · non-contrast
Comparison: None.

CLINICAL DATA: Posterior wrist pain after fall on [REDACTED].

EXAM:
RIGHT WRIST - COMPLETE 3+ VIEW

[wrist ap]
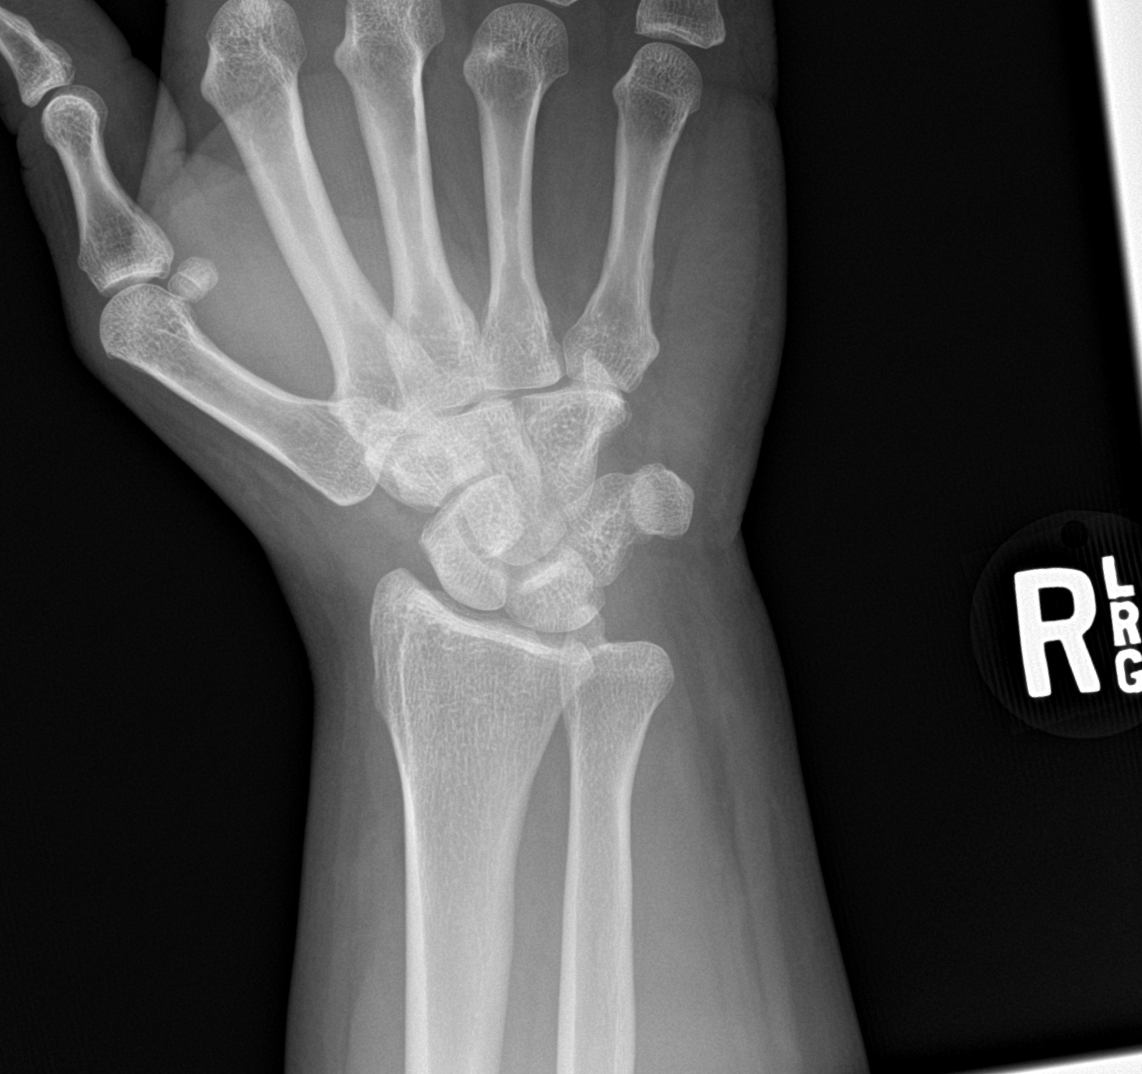

[wrist obl]
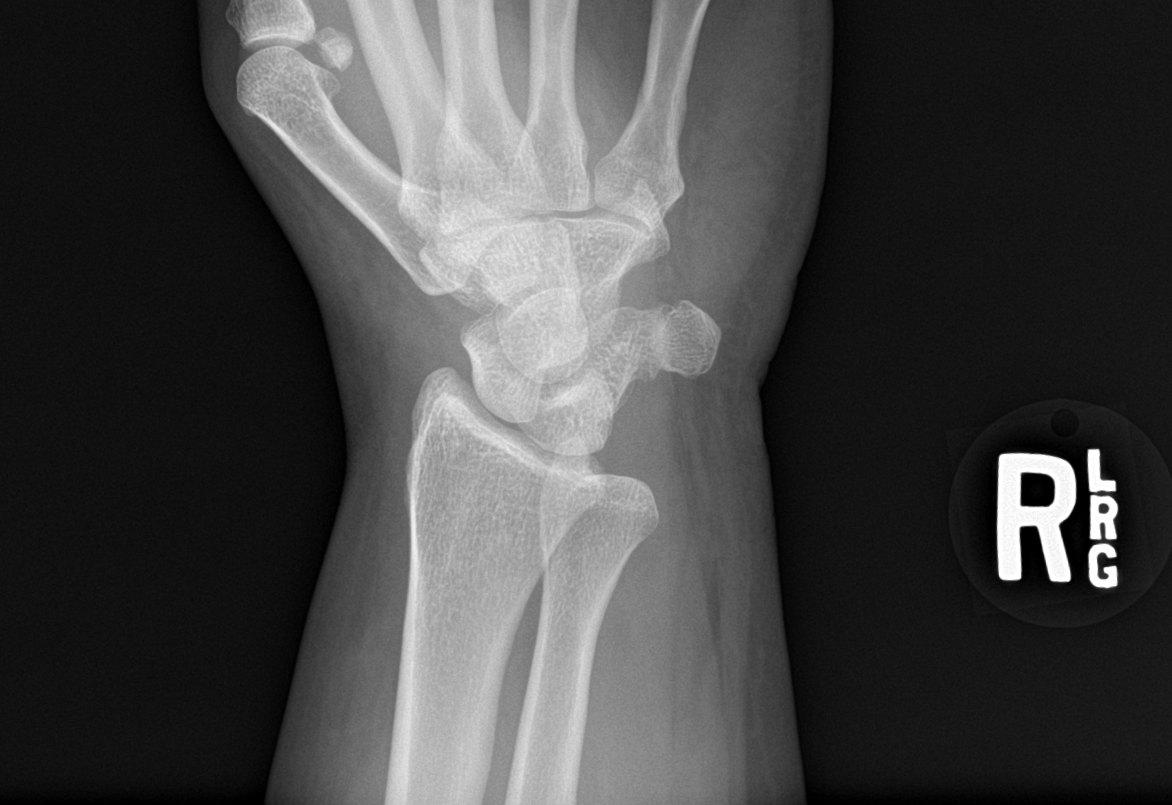

[wrist lat]
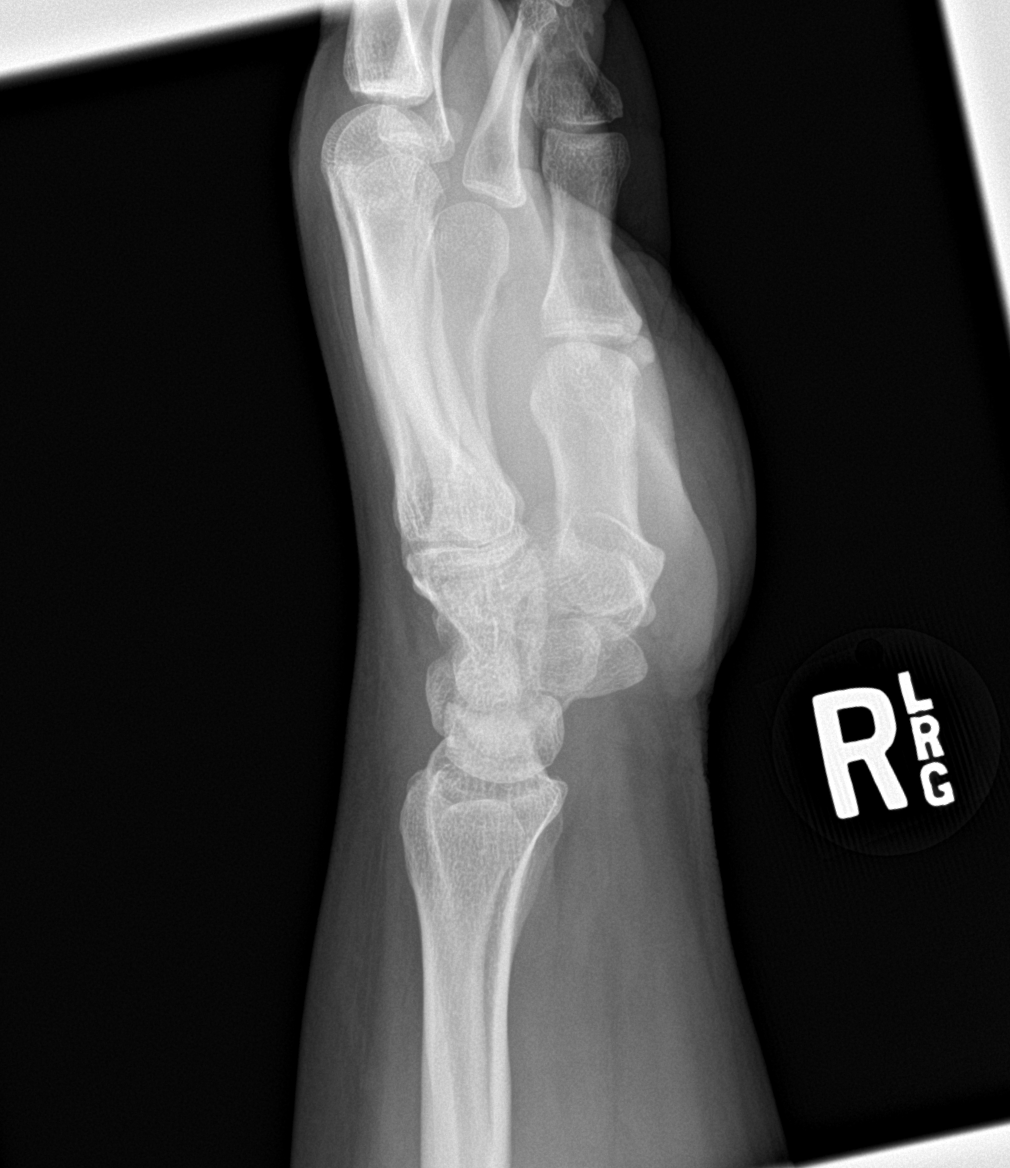

[3 of 3 positions shown; findings below may reference images not displayed]

FINDINGS: There is mild widening of the space between the triquetrum and
hamate bones but this is likely due to nonstandard positioning. No
definite evidence of any acute fracture or dislocation. Soft tissues
are unremarkable.
IMPRESSION: Widening of the space between the triquetrum and hamate bones,
likely due to positioning. No definite evidence of acute fracture or
dislocation.

## 2019-07-09 IMAGING — DX DG ANKLE COMPLETE 3+V*L*
3 series · 3 of 3 positions shown · non-contrast
Comparison: None.

CLINICAL DATA: Anterior left ankle pain and swelling for 6 months.
No known injury.

EXAM:
LEFT ANKLE COMPLETE - 3+ VIEW

[ankle ap]
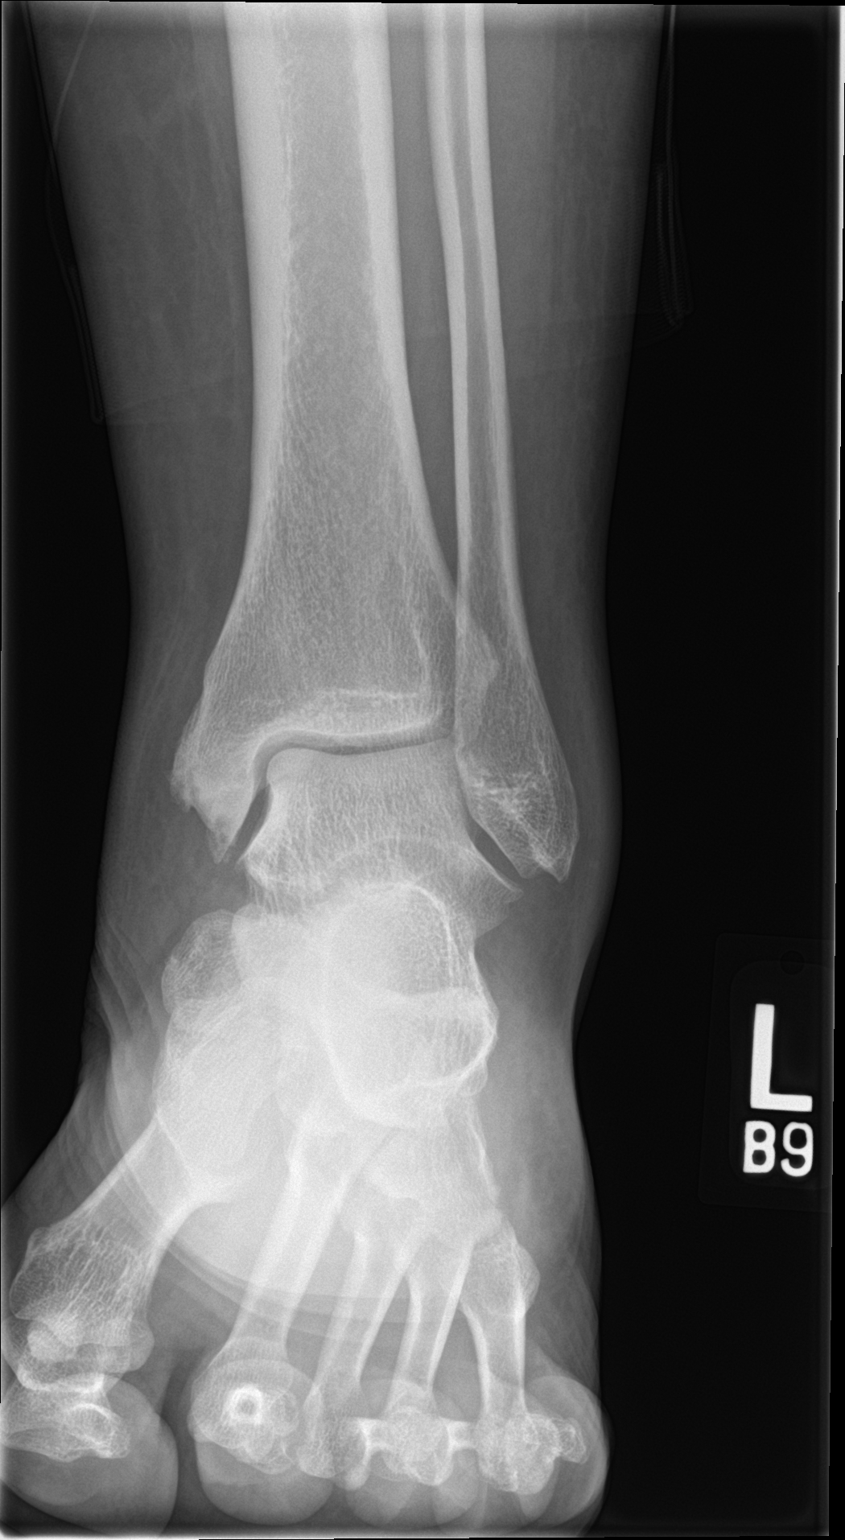

[ankle obl]
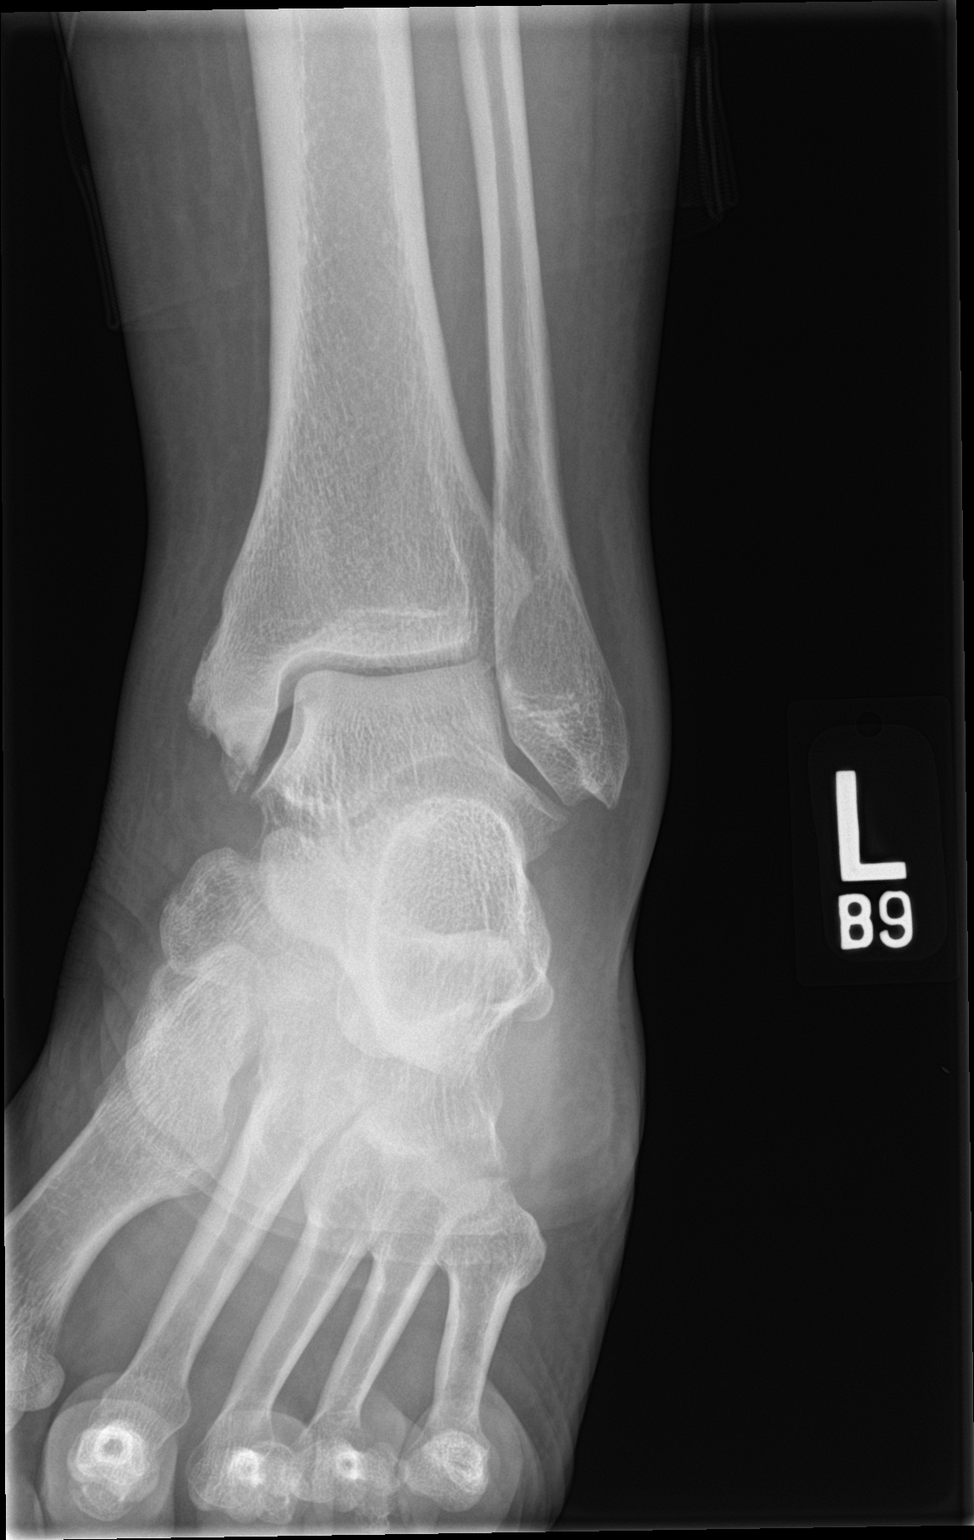

[ankle lat]
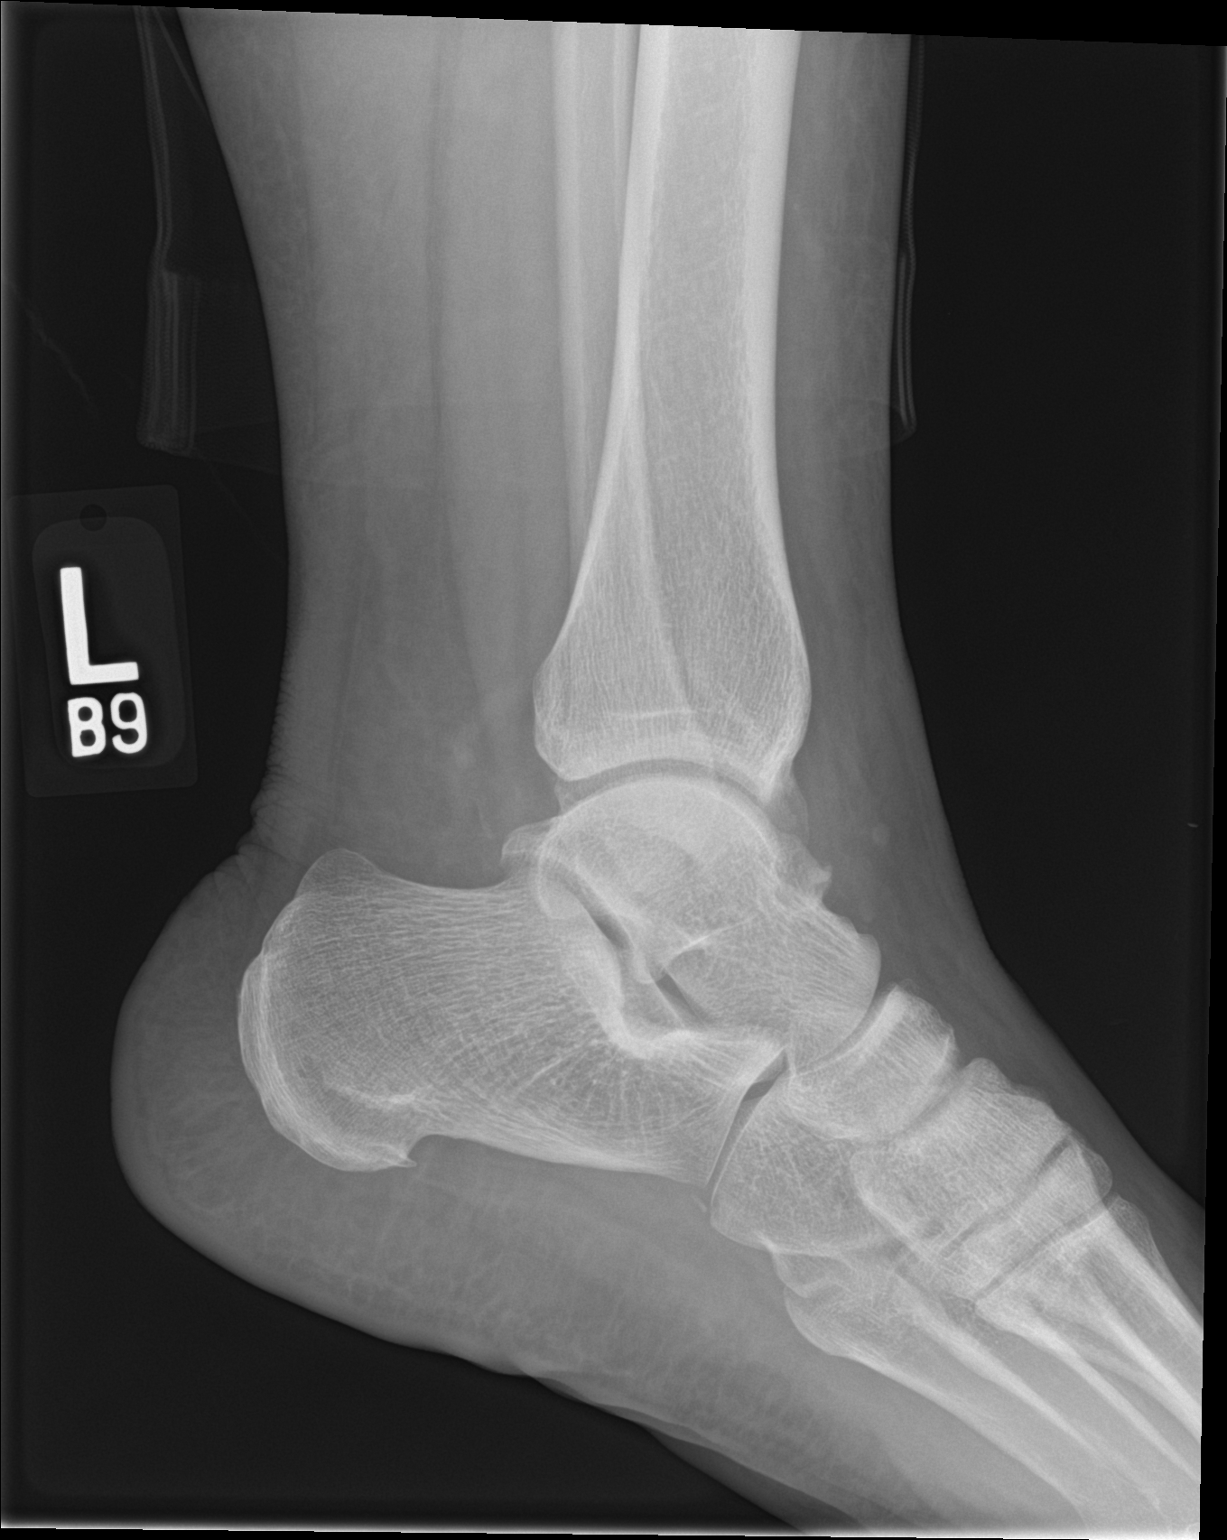

[3 of 3 positions shown; findings below may reference images not displayed]

FINDINGS: There is no evidence of fracture, dislocation, or joint effusion.
Tiny plantar calcaneal spur is seen. There is some tibiotalar
degenerative change most notable about the medial malleolus and
talus. Soft tissues are unremarkable.
IMPRESSION: No acute abnormality.

Mild tibiotalar degenerative change.

Tiny plantar calcaneal spur.

## 2019-07-21 DIAGNOSIS — Z20822 Contact with and (suspected) exposure to covid-19: Secondary | ICD-10-CM | POA: Diagnosis not present

## 2019-09-26 DIAGNOSIS — L700 Acne vulgaris: Secondary | ICD-10-CM | POA: Diagnosis not present

## 2019-10-05 ENCOUNTER — Telehealth: Payer: Self-pay | Admitting: Cardiology

## 2019-10-05 NOTE — Telephone Encounter (Signed)
New Message   Patient wanted a confirmation sent to her for her appointment. Walked patient through signing into [place].

## 2019-10-12 ENCOUNTER — Telehealth: Payer: Self-pay | Admitting: Cardiology

## 2019-10-12 NOTE — Telephone Encounter (Signed)
New Message    Pt is calling requesting a work letter to be sent to her before her appt. She is wondering if it can be emailed    Please advise

## 2019-10-13 NOTE — Telephone Encounter (Signed)
Spoke with patient and let her know that I just sent her a work letter for her upcoming appointment through her MyChart. She verbalizes understanding of this.    Encouraged patient to call back with any questions or concerns.

## 2019-10-17 ENCOUNTER — Ambulatory Visit: Payer: BC Managed Care – PPO | Admitting: Cardiology

## 2020-04-26 ENCOUNTER — Other Ambulatory Visit: Payer: BC Managed Care – PPO

## 2020-04-26 DIAGNOSIS — Z20822 Contact with and (suspected) exposure to covid-19: Secondary | ICD-10-CM

## 2020-04-26 DIAGNOSIS — Z1152 Encounter for screening for COVID-19: Secondary | ICD-10-CM | POA: Diagnosis not present

## 2020-04-27 LAB — NOVEL CORONAVIRUS, NAA: SARS-CoV-2, NAA: NOT DETECTED

## 2020-04-27 LAB — SARS-COV-2, NAA 2 DAY TAT

## 2020-04-29 ENCOUNTER — Telehealth: Payer: Self-pay | Admitting: General Practice

## 2020-04-29 NOTE — Telephone Encounter (Signed)
Patient called to get COVID results. Made her aware they were negative. 

## 2020-05-17 ENCOUNTER — Ambulatory Visit: Payer: BC Managed Care – PPO | Attending: Internal Medicine

## 2020-05-17 DIAGNOSIS — Z23 Encounter for immunization: Secondary | ICD-10-CM

## 2020-05-17 NOTE — Progress Notes (Signed)
° °  Covid-19 Vaccination Clinic  Name:  Lisa Pitts    MRN: [id number] DOB: [birth year]  05/17/2020  Lisa Pitts was observed post Covid-19 immunization for 15 minutes without incident. She was provided with Vaccine Information Sheet and instruction to access the V-Safe system.   Lisa Pitts was instructed to call 911 with any severe reactions post vaccine:  Difficulty breathing   Swelling of face and throat   A fast heartbeat   A bad rash all over body   Dizziness and weakness   Immunizations Administered    Name Date Dose VIS Date Route   Pfizer COVID-19 Vaccine 05/17/2020  3:56 PM 0.3 mL 04/17/2020 Intramuscular   Manufacturer: [employer], [employer]   Lot: [id number]   NDC: [address]-0

## 2020-06-03 ENCOUNTER — Ambulatory Visit: Payer: BC Managed Care – PPO | Attending: Internal Medicine

## 2020-06-03 DIAGNOSIS — Z23 Encounter for immunization: Secondary | ICD-10-CM

## 2020-06-03 NOTE — Progress Notes (Signed)
   Covid-19 Vaccination Clinic  Name:  Lisa Pitts    MRN: [id number] DOB: 1975-10-14  06/03/2020  Ms. Esters was observed post Covid-19 immunization for 15 minutes without incident. She was provided with Vaccine Information Sheet and instruction to access the V-Safe system.   [name] was instructed to call 911 with any severe reactions post vaccine: Marland Kitchen Difficulty breathing  . Swelling of face and throat  . A fast heartbeat  . A bad rash all over body  . Dizziness and weakness   Immunizations Administered    Name Date Dose VIS Date Route   Pfizer COVID-19 Vaccine 06/03/2020  2:44 PM 0.3 mL 04/17/2020 Intramuscular   Manufacturer: [employer], [employer]   Lot: [id number]   NDC: [address]-2

## 2021-04-29 DEATH — deceased
